# Patient Record
Sex: Female | Born: 1995 | Race: Black or African American | Hispanic: No | Marital: Single | Smoking: Former smoker
Health system: Southern US, Community
[De-identification: ages and names within clinical notes are randomized; demographics above are authoritative.]

## PROBLEM LIST (undated history)

## (undated) ENCOUNTER — Inpatient Hospital Stay: Payer: Self-pay

## (undated) DIAGNOSIS — IMO0001 Reserved for inherently not codable concepts without codable children: Secondary | ICD-10-CM

## (undated) DIAGNOSIS — E669 Obesity, unspecified: Secondary | ICD-10-CM

## (undated) DIAGNOSIS — E119 Type 2 diabetes mellitus without complications: Secondary | ICD-10-CM

## (undated) DIAGNOSIS — Z8619 Personal history of other infectious and parasitic diseases: Secondary | ICD-10-CM

## (undated) DIAGNOSIS — E282 Polycystic ovarian syndrome: Secondary | ICD-10-CM

## (undated) DIAGNOSIS — IMO0002 Reserved for concepts with insufficient information to code with codable children: Secondary | ICD-10-CM

## (undated) DIAGNOSIS — R030 Elevated blood-pressure reading, without diagnosis of hypertension: Secondary | ICD-10-CM

## (undated) DIAGNOSIS — F909 Attention-deficit hyperactivity disorder, unspecified type: Secondary | ICD-10-CM

## (undated) DIAGNOSIS — J45909 Unspecified asthma, uncomplicated: Secondary | ICD-10-CM

## (undated) DIAGNOSIS — O139 Gestational [pregnancy-induced] hypertension without significant proteinuria, unspecified trimester: Secondary | ICD-10-CM

## (undated) HISTORY — DX: Attention-deficit hyperactivity disorder, unspecified type: F90.9

## (undated) HISTORY — DX: Polycystic ovarian syndrome: E28.2

## (undated) HISTORY — DX: Obesity, unspecified: E66.9

## (undated) HISTORY — DX: Personal history of other infectious and parasitic diseases: Z86.19

## (undated) HISTORY — DX: Unspecified asthma, uncomplicated: J45.909

## (undated) HISTORY — DX: Reserved for concepts with insufficient information to code with codable children: IMO0002

## (undated) HISTORY — DX: Type 2 diabetes mellitus without complications: E11.9

## (undated) HISTORY — DX: Reserved for inherently not codable concepts without codable children: IMO0001

## (undated) HISTORY — DX: Elevated blood-pressure reading, without diagnosis of hypertension: R03.0

---
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
## 2014-03-22 ENCOUNTER — Emergency Department: Payer: Self-pay

## 2014-04-26 ENCOUNTER — Ambulatory Visit (HOSPITAL_COMMUNITY): Payer: Self-pay

## 2014-07-06 LAB — TSH: TSH: 2.3 u[IU]/mL (ref 0.41–5.90)

## 2014-07-06 LAB — BASIC METABOLIC PANEL
BUN: 13 mg/dL (ref 4–21)
Creatinine: 0.8 mg/dL (ref 0.5–1.1)
Glucose: 91 mg/dL
Potassium: 4.5 mmol/L (ref 3.4–5.3)
SODIUM: 138 mmol/L (ref 137–147)

## 2014-07-06 LAB — HEPATIC FUNCTION PANEL
ALT: 37 U/L — AB (ref 7–35)
AST: 32 U/L (ref 13–35)

## 2015-02-01 LAB — HEMOGLOBIN A1C: HEMOGLOBIN A1C: 6.6 % — AB (ref 4.0–6.0)

## 2015-02-01 LAB — LIPID PANEL
Cholesterol: 115 mg/dL (ref 0–200)
HDL: 30 mg/dL — AB (ref 35–70)
LDL CALC: 56 mg/dL
Triglycerides: 145 mg/dL (ref 40–160)

## 2015-02-01 LAB — HM PAP SMEAR: HM Pap smear: NEGATIVE

## 2015-02-27 ENCOUNTER — Ambulatory Visit: Payer: Self-pay | Admitting: *Deleted

## 2015-09-11 DIAGNOSIS — E669 Obesity, unspecified: Secondary | ICD-10-CM | POA: Insufficient documentation

## 2015-09-11 DIAGNOSIS — E119 Type 2 diabetes mellitus without complications: Secondary | ICD-10-CM | POA: Insufficient documentation

## 2015-09-11 DIAGNOSIS — L84 Corns and callosities: Secondary | ICD-10-CM | POA: Insufficient documentation

## 2015-09-11 DIAGNOSIS — J45909 Unspecified asthma, uncomplicated: Secondary | ICD-10-CM | POA: Insufficient documentation

## 2015-09-11 DIAGNOSIS — L83 Acanthosis nigricans: Secondary | ICD-10-CM | POA: Insufficient documentation

## 2015-09-11 DIAGNOSIS — L304 Erythema intertrigo: Secondary | ICD-10-CM | POA: Insufficient documentation

## 2015-09-11 DIAGNOSIS — E282 Polycystic ovarian syndrome: Secondary | ICD-10-CM | POA: Insufficient documentation

## 2015-09-11 DIAGNOSIS — Z920 Personal history of contraception: Secondary | ICD-10-CM | POA: Insufficient documentation

## 2015-09-11 DIAGNOSIS — L709 Acne, unspecified: Secondary | ICD-10-CM | POA: Insufficient documentation

## 2015-09-11 DIAGNOSIS — F909 Attention-deficit hyperactivity disorder, unspecified type: Secondary | ICD-10-CM | POA: Insufficient documentation

## 2015-09-11 DIAGNOSIS — N979 Female infertility, unspecified: Secondary | ICD-10-CM | POA: Insufficient documentation

## 2015-09-11 DIAGNOSIS — R030 Elevated blood-pressure reading, without diagnosis of hypertension: Secondary | ICD-10-CM | POA: Insufficient documentation

## 2015-09-12 ENCOUNTER — Ambulatory Visit (INDEPENDENT_AMBULATORY_CARE_PROVIDER_SITE_OTHER): Payer: Medicaid Other | Admitting: Family Medicine

## 2015-09-12 ENCOUNTER — Encounter: Payer: Self-pay | Admitting: Family Medicine

## 2015-09-12 ENCOUNTER — Telehealth: Payer: Self-pay | Admitting: Family Medicine

## 2015-09-12 VITALS — BP 104/70 | HR 98 | Temp 98.0°F | Resp 18 | Ht 65.0 in | Wt 348.0 lb

## 2015-09-12 DIAGNOSIS — N760 Acute vaginitis: Secondary | ICD-10-CM

## 2015-09-12 DIAGNOSIS — R350 Frequency of micturition: Secondary | ICD-10-CM | POA: Diagnosis not present

## 2015-09-12 DIAGNOSIS — N912 Amenorrhea, unspecified: Secondary | ICD-10-CM

## 2015-09-12 LAB — POCT URINALYSIS DIPSTICK
BILIRUBIN UA: NEGATIVE
Glucose, UA: NEGATIVE
KETONES UA: NEGATIVE
NITRITE UA: NEGATIVE
PH UA: 6
PROTEIN UA: NEGATIVE
Spec Grav, UA: 1.01
Urobilinogen, UA: 0.2

## 2015-09-12 LAB — POCT URINE PREGNANCY: Preg Test, Ur: NEGATIVE

## 2015-09-12 MED ORDER — METRONIDAZOLE 0.75 % VA GEL: 1.0000 | Freq: Every day | VAGINAL | Status: DC

## 2015-09-12 NOTE — Progress Notes (Signed)
       Patient: Carrie LombardCierra M Gilmartin Female    DOB: 06/17/1996   19 y.o.   MRN: [id number] Visit Date: 09/12/2015  Today's Provider: Mila Merryonald Fisher, MD   Chief Complaint  Patient presents with  . Vaginal Itching   Subjective:    Vaginal Itching The patient's primary symptoms include genital itching, genital lesions (bumps on the inside the vagina), a genital odor, missed menses, pelvic pain and vaginal bleeding (some spotting). The patient's pertinent negatives include no vaginal discharge. This is a new problem. Episode onset: started 2 weeks ago. The problem occurs daily. The problem has been gradually worsening. Pregnant now: unknown ; last menses was 1.5-2 months ago. Associated symptoms include abdominal pain, back pain (low back), frequency, joint pain, nausea (occasionally) and urgency. Pertinent negatives include no anorexia, chills, constipation, diarrhea, discolored urine, dysuria, fever, flank pain, headaches, hematuria, joint swelling, painful intercourse, rash, sore throat or vomiting. Treatments tried: lotion. The treatment provided no relief. She is sexually active (has a new partner). It is unknown whether or not her partner has an STD. She uses condoms (not cosistent with condom use) for contraception. Her menstrual history has been regular. Her past medical history is significant for an STD (history of Chlamydia).       Allergies  Allergen Reactions  . Cefzil  [Cefprozil]    Previous Medications   METFORMIN (GLUCOPHAGE-XR) 500 MG 24 HR TABLET    Take 2 tablets by mouth 2 (two) times daily.    Review of Systems  Constitutional: Negative for fever and chills.  HENT: Negative for sore throat.   Gastrointestinal: Positive for nausea (occasionally) and abdominal pain. Negative for vomiting, diarrhea, constipation and anorexia.  Genitourinary: Positive for urgency, frequency, pelvic pain and missed menses. Negative for dysuria, hematuria, flank pain and vaginal discharge.    Musculoskeletal: Positive for back pain (low back) and joint pain.  Skin: Negative for rash.  Neurological: Negative for headaches.    Social History  Substance Use Topics  . Smoking status: Former Smoker    Quit date: 08/12/2013  . Smokeless tobacco: Not on file  . Alcohol Use: No   Objective:   BP 104/70 mmHg  Pulse 98  Temp(Src) 98 F (36.7 C) (Oral)  Resp 18  Ht 5\' 5"  (1.651 m)  Wt 348 lb (157.852 kg)  BMI 57.91 kg/m2  SpO2 98%  LMP  (Within Months)  Physical Exam  GU: Vague swelling left distal vaginal wall. Moderate amount clear malodorous discharge. No bleeding. No cervical inflammation.     Assessment & Plan:     1. Urinary frequency  - POCT Urinalysis Dipstick  2. Amenorrhea - POCT urine pregnancy  3. Vaginitis  - metroNIDAZOLE (METROGEL VAGINAL) 0.75 % vaginal gel; Place 1 Applicatorful vaginally at bedtime. For 7 days  Dispense: 70 g; Refill: 0 - GC/chlamydia probe amp, urine       Mila Merryonald Fisher, MD  [facility]

## 2015-09-12 NOTE — Telephone Encounter (Signed)
Marchelle Folksmanda with [facility] is requesting a call back to verify the Rx for metroNIDAZOLE (METROGEL VAGINAL) 0.75 % vaginal gel.  CB#[phone]/MW

## 2015-09-13 ENCOUNTER — Encounter: Payer: Self-pay | Admitting: Family Medicine

## 2015-09-16 ENCOUNTER — Telehealth: Payer: Self-pay | Admitting: Family Medicine

## 2015-09-16 NOTE — Telephone Encounter (Signed)
Advised Tonya as below.

## 2015-09-16 NOTE — Telephone Encounter (Signed)
Tonya at [facility] needs a test number for the specimin they have on pt.  Please call back [phone]  Braulio Boschhnaks Teri

## 2015-09-18 ENCOUNTER — Telehealth: Payer: Self-pay | Admitting: Family Medicine

## 2015-09-18 NOTE — Telephone Encounter (Signed)
Please advise 

## 2015-09-18 NOTE — Telephone Encounter (Signed)
Pt is calling to see if Lab results have came back from last week. Please return pt's call @ [phone] Thanks CC

## 2015-09-19 LAB — GC/CHLAMYDIA PROBE AMP, URINE
Chlamydia, Nuc. Acid Amp: NEGATIVE
Gonococcus, Nuc. Acid Amp: NEGATIVE

## 2015-09-19 NOTE — Telephone Encounter (Signed)
Tried calling patient. Left message to call back. 

## 2015-09-19 NOTE — Telephone Encounter (Signed)
Chlamydia and gonorrhea cultures were negative. Should resolve after finishing metronidazole cream that was prescribed last week. If not then call for gyn referral.

## 2015-09-25 NOTE — Telephone Encounter (Signed)
Patient advised as directed below. Patient states she finished the metronidazole and symptoms are better now.

## 2015-10-07 ENCOUNTER — Emergency Department: Payer: Medicaid Other

## 2015-10-07 ENCOUNTER — Emergency Department
Admission: EM | Admit: 2015-10-07 | Discharge: 2015-10-07 | Disposition: A | Discharge status: Home / Self Care | Payer: Medicaid Other | Attending: Emergency Medicine | Admitting: Emergency Medicine

## 2015-10-07 ENCOUNTER — Encounter: Payer: Self-pay | Admitting: Emergency Medicine

## 2015-10-07 DIAGNOSIS — J45909 Unspecified asthma, uncomplicated: Secondary | ICD-10-CM | POA: Insufficient documentation

## 2015-10-07 DIAGNOSIS — J209 Acute bronchitis, unspecified: Secondary | ICD-10-CM

## 2015-10-07 DIAGNOSIS — Z87891 Personal history of nicotine dependence: Secondary | ICD-10-CM | POA: Diagnosis not present

## 2015-10-07 DIAGNOSIS — Z7984 Long term (current) use of oral hypoglycemic drugs: Secondary | ICD-10-CM | POA: Insufficient documentation

## 2015-10-07 DIAGNOSIS — E119 Type 2 diabetes mellitus without complications: Secondary | ICD-10-CM | POA: Insufficient documentation

## 2015-10-07 DIAGNOSIS — R51 Headache: Secondary | ICD-10-CM | POA: Diagnosis present

## 2015-10-07 DIAGNOSIS — Z79899 Other long term (current) drug therapy: Secondary | ICD-10-CM | POA: Insufficient documentation

## 2015-10-07 MED ORDER — BENZONATATE 100 MG PO CAPS
200.0000 mg | ORAL_CAPSULE | Freq: Once | ORAL | Status: AC
  Administered 2015-10-07: 200 mg via ORAL

## 2015-10-07 MED ORDER — AZITHROMYCIN 250 MG PO TABS
ORAL_TABLET | ORAL | Status: AC
  Filled 2015-10-07: qty 2

## 2015-10-07 MED ORDER — BENZONATATE 100 MG PO CAPS
ORAL_CAPSULE | ORAL | Status: AC
  Filled 2015-10-07: qty 2

## 2015-10-07 MED ORDER — BENZONATATE 200 MG PO CAPS: 200.0000 mg | ORAL_CAPSULE | Freq: Three times a day (TID) | ORAL | Status: DC | PRN

## 2015-10-07 MED ORDER — AZITHROMYCIN 250 MG PO TABS: 500.0000 mg | ORAL_TABLET | Freq: Every day | ORAL | Status: AC

## 2015-10-07 MED ORDER — AZITHROMYCIN 250 MG PO TABS
500.0000 mg | ORAL_TABLET | Freq: Once | ORAL | Status: AC
  Administered 2015-10-07: 500 mg via ORAL

## 2015-10-07 NOTE — Discharge Instructions (Signed)

## 2015-10-07 NOTE — ED Provider Notes (Signed)
[facility] Emergency Department Provider Note  ____________________________________________  Time seen: 7:05 PM  I have reviewed the triage vital signs and the nursing notes.   HISTORY  Chief Complaint Headache      HPI Carrie Garza is a 19 y.o. female presents with productive cough neck pain chest discomfort with coughing 5 days. Patient also states that she noticed red streaks in the sputum.     Past Medical History  Diagnosis Date  . Knee fracture, left 02/2003  . Diabetes mellitus without complication (HCC)   . Polycystic ovarian syndrome   . ADHD (attention deficit hyperactivity disorder)   . Obesity   . Asthma   . Elevated blood pressure   . History of chicken pox     Patient Active Problem List   Diagnosis Date Noted  . Acanthosis nigricans 09/11/2015  . Acne 09/11/2015  . ADHD (attention deficit hyperactivity disorder) 09/11/2015  . Asthma 09/11/2015  . Callus of foot 09/11/2015  . History of contraceptive use 09/11/2015  . Blood pressure elevated without history of HTN 09/11/2015  . Female infertility 09/11/2015  . Eczema intertrigo 09/11/2015  . Obesity 09/11/2015  . Polycystic ovarian syndrome 09/11/2015  . Diabetes mellitus, type 2 (HCC) 09/11/2015  . History of chlamydia 07/29/2009    Past Surgical History  Procedure Laterality Date  . Labioplasty  2008    hypertrophy B labia. Toya Smothers  . Tonsillectomy and adenoidectomy  2001    Current Outpatient Rx  Name  Route  Sig  Dispense  Refill  . metFORMIN (GLUCOPHAGE-XR) 500 MG 24 hr tablet   Oral   Take 2 tablets by mouth 2 (two) times daily.         . metroNIDAZOLE (METROGEL VAGINAL) 0.75 % vaginal gel   Vaginal   Place 1 Applicatorful vaginally at bedtime. For 7 days   70 g   0     Allergies Cefzil   Family History  Problem Relation Age of Onset  . Diabetes Mother   . Hypertension Mother   . Asthma Mother   . Obesity Mother   . Hypertension  Maternal Grandmother   . Prostate cancer Maternal Grandfather   . Hypertension Maternal Grandfather   . Diabetes Other     type 2  . Stroke Other     Social History Social History  Substance Use Topics  . Smoking status: Former Smoker    Quit date: 08/12/2013  . Smokeless tobacco: None  . Alcohol Use: No    Review of Systems  Constitutional: Negative for fever. Eyes: Negative for visual changes. ENT: Negative for sore throat. Cardiovascular: Negative for chest pain. Respiratory: Negative for shortness of breath. Positive for cough Gastrointestinal: Negative for abdominal pain, vomiting and diarrhea. Genitourinary: Negative for dysuria. Musculoskeletal: Negative for back pain. Skin: Negative for rash. Neurological: Negative for headaches, focal weakness or numbness.   10-point ROS otherwise negative.  ____________________________________________   PHYSICAL EXAM:  VITAL SIGNS: ED Triage Vitals  Enc Vitals Group     BP 10/07/15 1807 120/72 mmHg     Pulse Rate 10/07/15 1807 62     Resp 10/07/15 1807 20     Temp 10/07/15 1807 98.7 F (37.1 C)     Temp Source 10/07/15 1807 Oral     SpO2 10/07/15 1807 97 %     Weight 10/07/15 1807 333 lb (151.048 kg)     Height 10/07/15 1807  (1.676 m)     Head Cir --  Peak Flow --      Pain Score 10/07/15 1815 6     Pain Loc --      Pain Edu? --      Excl. in GC? --      Constitutional: Alert and oriented. Well appearing and in no distress. Eyes: Conjunctivae are normal. PERRL. Normal extraocular movements. ENT   Head: Normocephalic and atraumatic.   Nose: No congestion/rhinnorhea.   Mouth/Throat: Mucous membranes are moist.   Neck: No stridor. Hematological/Lymphatic/Immunilogical: No cervical lymphadenopathy. Cardiovascular: Normal rate, regular rhythm. Normal and symmetric distal pulses are present in all extremities. No murmurs, rubs, or gallops. Respiratory: Normal respiratory effort without  tachypnea nor retractions. Breath sounds are clear and equal bilaterally. No wheezes/rales/rhonchi. Gastrointestinal: Soft and nontender. No distention. There is no CVA tenderness. Genitourinary: deferred Musculoskeletal: Nontender with normal range of motion in all extremities. No joint effusions.  No lower extremity tenderness nor edema. Neurologic:  Normal speech and language. No gross focal neurologic deficits are appreciated. Speech is normal.  Skin:  Skin is warm, dry and intact. No rash noted. Psychiatric: Mood and affect are normal. Speech and behavior are normal. Patient exhibits appropriate insight and judgment.    RADIOLOGY      DG Chest 2 View (Final result) Result time: 10/07/15 19:10:08   Final result by Rad Results In Interface (10/07/15 19:10:08)   Narrative:   CLINICAL DATA: Cough  EXAM: CHEST 2 VIEW  COMPARISON: 02/03/2012  FINDINGS: The heart size and mediastinal contours are within normal limits. Both lungs are clear. The visualized skeletal structures are unremarkable.  IMPRESSION: No active cardiopulmonary disease.   Electronically Signed By: Signa Kellaylor Stroud M.D. On: 10/07/2015 19:10         INITIAL IMPRESSION / ASSESSMENT AND PLAN / ED COURSE  Pertinent labs & imaging results that were available during my care of the patient were reviewed by me and considered in my medical decision making (see chart for details).  History of physical exam consistent with acute bronchitis. Patient received azithromycin 500 mg and Tessalon Perles.  ____________________________________________   FINAL CLINICAL IMPRESSION(S) / ED DIAGNOSES  Final diagnoses:  Acute bronchitis, unspecified organism      Darci Currentandolph N Brown, MD 10/07/15 [phone]

## 2015-10-07 NOTE — ED Notes (Signed)
Spoke with dr Carollee Massedkaminski, no labs needed at this time.

## 2015-10-07 NOTE — ED Notes (Addendum)
C/o headache, neck pain, and lung pain.  Denies fevers. Pt reports hurts head if moves her neck. Has had productive cough with blood spots in it per pt.  Denies any fevers. Has been having night sweats.  Denies unwanted weight loss. Mask given to pt. Pt looks well in triage playing on phone and moving neck; smiling.

## 2015-10-07 NOTE — ED Notes (Signed)
Pt states "my lungs hurt", states pain with coughing and neck pain on the sides of her neck, states some blood tinged sputum but states she has been coughing a lot, pt ambulatory to room in no acute distress

## 2015-10-07 NOTE — ED Notes (Signed)
Pt reports productive cough since Wednesday that's been keeping her awake. Pt denies any N/V/D or known fever.

## 2015-11-14 ENCOUNTER — Encounter: Payer: Self-pay | Admitting: Family Medicine

## 2015-11-14 ENCOUNTER — Ambulatory Visit (INDEPENDENT_AMBULATORY_CARE_PROVIDER_SITE_OTHER): Payer: Medicaid Other | Admitting: Family Medicine

## 2015-11-14 VITALS — BP 110/80 | HR 77 | Temp 98.7°F | Resp 16 | Ht 65.0 in | Wt 330.0 lb

## 2015-11-14 DIAGNOSIS — E119 Type 2 diabetes mellitus without complications: Secondary | ICD-10-CM

## 2015-11-14 DIAGNOSIS — L739 Follicular disorder, unspecified: Secondary | ICD-10-CM | POA: Diagnosis not present

## 2015-11-14 LAB — POCT GLYCOSYLATED HEMOGLOBIN (HGB A1C)
ESTIMATED AVERAGE GLUCOSE: 120
HEMOGLOBIN A1C: 5.8

## 2015-11-14 MED ORDER — MUPIROCIN CALCIUM 2 % EX CREA: 1.0000 "application " | TOPICAL_CREAM | Freq: Two times a day (BID) | CUTANEOUS | Status: DC

## 2015-11-14 NOTE — Progress Notes (Signed)
Patient: Carrie Garza Female    DOB: 07/04/1996   20 y.o.   MRN: [id number] Visit Date: 11/14/2015  Today's Provider: Mila Merry, MD   Chief Complaint  Patient presents with  . Rash   Subjective:    Rash This is a new problem. The current episode started 1 to 4 weeks ago. The problem is unchanged. Location: bump on left and right. The rash is characterized by redness and blistering. Pertinent negatives include no congestion, cough, diarrhea, eye pain, facial edema, fatigue, fever, rhinorrhea, shortness of breath, sore throat or vomiting. Past treatments include nothing. The treatment provided no relief.    Bumps on both breasts for last two weeks. Red and infected. No itching, no pain.    Diabetes: Is due for follow up diabetes. Is not check sugars regularly, but feels well. Is taking metformin every day.  Lab Results  Component Value Date   HGBA1C 6.6* 02/01/2015      Allergies  Allergen Reactions  . Cefzil  [Cefprozil]    Previous Medications   METFORMIN (GLUCOPHAGE-XR) 500 MG 24 HR TABLET    Take 2 tablets by mouth 2 (two) times daily.    Review of Systems  Constitutional: Negative for fever, chills, appetite change and fatigue.  HENT: Negative for congestion, rhinorrhea and sore throat.   Eyes: Negative for pain.  Respiratory: Negative for cough, chest tightness and shortness of breath.   Cardiovascular: Negative for chest pain and palpitations.  Gastrointestinal: Negative for nausea, vomiting, abdominal pain and diarrhea.  Skin: Positive for rash.  Neurological: Negative for dizziness and weakness.    Social History  Substance Use Topics  . Smoking status: Former Smoker    Quit date: 08/12/2013  . Smokeless tobacco: Not on file  . Alcohol Use: No   Objective:   BP 110/80 mmHg  Pulse 77  Temp(Src) 98.7 F (37.1 C) (Oral)  Resp 16  Ht  (1.651 m)  Wt 330 lb (149.687 kg)  BMI 54.91 kg/m2  SpO2 96%  LMP 09/29/2015  Physical  Exam  General Appearance:    Alert, cooperative, no distress, obese  Eyes:    PERRL, conjunctiva/corneas clear, EOM's intact       Lungs:     Clear to auscultation bilaterally, respirations unlabored  Heart:    Regular rate and rhythm  Neurologic:   Awake, alert, oriented x 3. No apparent focal neurological           defect.   Skin:   Small red follicular lesion on superior aspects of right and left breasts, no surrounding erythema. Minimally tender.     Results for orders placed or performed in visit on 11/14/15  POCT glycosylated hemoglobin (Hb A1C)  Result Value Ref Range   Hemoglobin A1C 5.8    Est. average glucose Bld gHb Est-mCnc 120        Assessment & Plan:     1. Type 2 diabetes mellitus without complication, without long-term current use of insulin (HCC)  Continue current dose of metformin - POCT glycosylated hemoglobin (Hb A1C)  2. Folliculitis- New problem Deroofed lesion on left breast and swabbed for culture. Start mupirocin for empiric coverage of staph.  - Wound culture - mupirocin cream (BACTROBAN) 2 %; Apply 1 application topically 2 (two) times daily.  Dispense: 15 g; Refill: 0  Return in about 6 months (around 05/13/2016).       Mila Merry, MD  [facility]

## 2015-11-17 LAB — WOUND CULTURE: Organism ID, Bacteria: NONE SEEN

## 2016-03-05 ENCOUNTER — Encounter: Payer: Self-pay | Admitting: Family Medicine

## 2016-03-05 ENCOUNTER — Ambulatory Visit (INDEPENDENT_AMBULATORY_CARE_PROVIDER_SITE_OTHER): Payer: Medicaid Other | Admitting: Family Medicine

## 2016-03-05 VITALS — BP 112/70 | HR 62 | Temp 99.2°F | Resp 16 | Wt 310.0 lb

## 2016-03-05 DIAGNOSIS — Z3201 Encounter for pregnancy test, result positive: Secondary | ICD-10-CM | POA: Diagnosis not present

## 2016-03-05 DIAGNOSIS — R42 Dizziness and giddiness: Secondary | ICD-10-CM | POA: Insufficient documentation

## 2016-03-05 DIAGNOSIS — R1030 Lower abdominal pain, unspecified: Secondary | ICD-10-CM

## 2016-03-05 LAB — POCT URINALYSIS DIPSTICK
Bilirubin, UA: NEGATIVE
GLUCOSE UA: NEGATIVE
Ketones, UA: NEGATIVE
NITRITE UA: NEGATIVE
PROTEIN UA: NEGATIVE
Spec Grav, UA: 1.025
UROBILINOGEN UA: 0.2
pH, UA: 6

## 2016-03-05 LAB — POCT URINE PREGNANCY: PREG TEST UR: POSITIVE — AB

## 2016-03-05 NOTE — Progress Notes (Signed)
Patient: Carrie Garza Female    DOB: 05/16/1996   20 y.o.   MRN: [id number] Visit Date: 03/05/2016  Today's Provider: Mila Merry, MD   Chief Complaint  Patient presents with  . Abdominal Pain  . Dizziness   Subjective:    HPI Comments: Pain has only occurred twice, each episode was crampy, occurred in lower pelvis, and lasted about 30 seconds. The last episode was 3 days ago.   Abdominal Pain This is a new problem. Episode onset: 2 weeks ago. The problem occurs intermittently. The problem has been unchanged. Pain location: lowe abdomen; patient describes the location in her uterus  The quality of the pain is sharp. The abdominal pain radiates to the perineum. Associated symptoms include constipation, headaches and weight loss. Pertinent negatives include no anorexia, arthralgias, belching, diarrhea, dysuria, fever, flatus, frequency, hematuria, melena, myalgias, nausea or vomiting. Nothing aggravates the pain.  Dizziness This is a new problem. Episode onset: 1 month ago. The problem occurs intermittently. Associated symptoms include abdominal pain and headaches. Pertinent negatives include no anorexia, arthralgias, chest pain, chills, congestion, coughing, diaphoresis, fatigue, fever, myalgias, nausea, numbness, sore throat, urinary symptoms, vomiting or weakness. The symptoms are aggravated by bending.  Dizziness occurs after bending over or turning head quickly. Patient states she sometimes feels off balance.   She states LMP was mid April. She has had no bleeding since period stopped over 3 weeks ago.      Allergies  Allergen Reactions  . Cefzil  [Cefprozil]    Previous Medications   METFORMIN (GLUCOPHAGE-XR) 500 MG 24 HR TABLET    Take 2 tablets by mouth 2 (two) times daily.   MUPIROCIN CREAM (BACTROBAN) 2 %    Apply 1 application topically 2 (two) times daily.    Review of Systems  Constitutional: Positive for weight loss. Negative for fever, chills, diaphoresis,  appetite change and fatigue.  HENT: Negative for congestion and sore throat.   Respiratory: Negative for cough, chest tightness and shortness of breath.   Cardiovascular: Negative for chest pain and palpitations.  Gastrointestinal: Positive for abdominal pain and constipation. Negative for nausea, vomiting, diarrhea, melena, anorexia and flatus.  Genitourinary: Negative for dysuria, frequency and hematuria.       Tender breast  Musculoskeletal: Negative for myalgias and arthralgias.  Neurological: Positive for dizziness, light-headedness and headaches. Negative for weakness and numbness.    Social History  Substance Use Topics  . Smoking status: Former Smoker    Quit date: 08/12/2013  . Smokeless tobacco: Not on file  . Alcohol Use: No   Objective:   BP 112/70 mmHg  Pulse 62  Temp(Src) 99.2 F (37.3 C) (Oral)  Resp 16  Wt 310 lb (140.615 kg)  SpO2 99%  Physical Exam  General Appearance:    Alert, cooperative, no distress, obese  Eyes:    PERRL, conjunctiva/corneas clear, EOM's intact       Lungs:     Clear to auscultation bilaterally, respirations unlabored  Heart:    Regular rate and rhythm  Neurologic:   Awake, alert, oriented x 3. No apparent focal neurological           defect.            Assessment & Plan:     1. Lower abdominal pain Two brief episodes, now resolved.  - POCT urinalysis dipstick - POCT urine pregnancy - Comprehensive metabolic panel - CBC - hCG, Beta Subunit, Qn (Serial)  2. Positive pregnancy  test  - Comprehensive metabolic panel - CBC - hCG, Beta Subunit, Qn (Serial)  3. Dizziness  - Comprehensive metabolic panel - CBC - hCG, Beta Subunit, Qn (Serial)     The entirety of the information documented in the History of Present Illness, Review of Systems and Physical Exam were personally obtained by me. Portions of this information were initially documented by Awilda Billoshena Chambers, CMA and reviewed by me for thoroughness and accuracy.     Mila Merryonald Osualdo Hansell, MD  [facility]\ \

## 2016-03-06 ENCOUNTER — Telehealth: Payer: Self-pay | Admitting: *Deleted

## 2016-03-06 ENCOUNTER — Telehealth: Payer: Self-pay | Admitting: Family Medicine

## 2016-03-06 DIAGNOSIS — Z3201 Encounter for pregnancy test, result positive: Secondary | ICD-10-CM

## 2016-03-06 LAB — COMPREHENSIVE METABOLIC PANEL
ALT: 24 IU/L (ref 0–32)
AST: 23 IU/L (ref 0–40)
Albumin/Globulin Ratio: 1.6 (ref 1.2–2.2)
Albumin: 4.1 g/dL (ref 3.5–5.5)
Alkaline Phosphatase: 53 IU/L (ref 39–117)
BUN / CREAT RATIO: 8 — AB (ref 9–23)
BUN: 6 mg/dL (ref 6–20)
Bilirubin Total: 0.3 mg/dL (ref 0.0–1.2)
CALCIUM: 9.5 mg/dL (ref 8.7–10.2)
CHLORIDE: 105 mmol/L (ref 96–106)
CO2: 17 mmol/L — AB (ref 18–29)
Creatinine, Ser: 0.71 mg/dL (ref 0.57–1.00)
GFR calc Af Amer: 140 mL/min/{1.73_m2} (ref >59)
GFR calc non Af Amer: 121 mL/min/{1.73_m2} (ref >59)
GLOBULIN, TOTAL: 2.6 g/dL (ref 1.5–4.5)
Glucose: 101 mg/dL — ABNORMAL HIGH (ref 65–99)
Potassium: 4 mmol/L (ref 3.5–5.2)
SODIUM: 139 mmol/L (ref 134–144)
TOTAL PROTEIN: 6.7 g/dL (ref 6.0–8.5)

## 2016-03-06 LAB — CBC
Hematocrit: 36.6 % (ref 34.0–46.6)
Hemoglobin: 12.1 g/dL (ref 11.1–15.9)
MCH: 27.1 pg (ref 26.6–33.0)
MCHC: 33.1 g/dL (ref 31.5–35.7)
MCV: 82 fL (ref 79–97)
PLATELETS: 277 10*3/uL (ref 150–379)
RBC: 4.46 x10E6/uL (ref 3.77–5.28)
RDW: 15.6 % — AB (ref 12.3–15.4)
WBC: 7.7 10*3/uL (ref 3.4–10.8)

## 2016-03-06 LAB — HCG, BETA SUBUNIT, QN (SERIAL): HCG, Beta Chain, Quant, S: 12345 m[IU]/mL

## 2016-03-06 NOTE — Telephone Encounter (Signed)
-----   Message from Malva Limesonald E Fisher, MD sent at 03/06/2016  7:54 AM EDT ----- Labs confirm pregnancy, otherwise normal. Recommend referral to gyn if she doesn't already have one.

## 2016-03-06 NOTE — Telephone Encounter (Signed)
Please schedule appt to gyn? Thanks!

## 2016-03-06 NOTE — Telephone Encounter (Signed)
Patient was notified of results. Expressed understanding.  

## 2016-03-06 NOTE — Telephone Encounter (Signed)
Pt is requesting results of lab work.Phone # in chart is correct

## 2016-03-13 ENCOUNTER — Emergency Department
Admission: EM | Admit: 2016-03-13 | Discharge: 2016-03-13 | Disposition: A | Discharge status: Home / Self Care | Payer: Medicaid Other | Attending: Emergency Medicine | Admitting: Emergency Medicine

## 2016-03-13 ENCOUNTER — Encounter: Payer: Self-pay | Admitting: Emergency Medicine

## 2016-03-13 ENCOUNTER — Emergency Department: Payer: Medicaid Other

## 2016-03-13 DIAGNOSIS — O209 Hemorrhage in early pregnancy, unspecified: Secondary | ICD-10-CM

## 2016-03-13 DIAGNOSIS — A599 Trichomoniasis, unspecified: Secondary | ICD-10-CM

## 2016-03-13 DIAGNOSIS — J45909 Unspecified asthma, uncomplicated: Secondary | ICD-10-CM | POA: Diagnosis not present

## 2016-03-13 DIAGNOSIS — F909 Attention-deficit hyperactivity disorder, unspecified type: Secondary | ICD-10-CM | POA: Diagnosis not present

## 2016-03-13 DIAGNOSIS — Z3A01 Less than 8 weeks gestation of pregnancy: Secondary | ICD-10-CM | POA: Insufficient documentation

## 2016-03-13 DIAGNOSIS — E119 Type 2 diabetes mellitus without complications: Secondary | ICD-10-CM | POA: Insufficient documentation

## 2016-03-13 DIAGNOSIS — Z87891 Personal history of nicotine dependence: Secondary | ICD-10-CM | POA: Insufficient documentation

## 2016-03-13 DIAGNOSIS — A5901 Trichomonal vulvovaginitis: Secondary | ICD-10-CM | POA: Insufficient documentation

## 2016-03-13 DIAGNOSIS — O200 Threatened abortion: Secondary | ICD-10-CM | POA: Diagnosis not present

## 2016-03-13 DIAGNOSIS — Z7984 Long term (current) use of oral hypoglycemic drugs: Secondary | ICD-10-CM | POA: Insufficient documentation

## 2016-03-13 LAB — ABO/RH: ABO/RH(D): B POS

## 2016-03-13 LAB — RAPID HIV SCREEN (HIV 1/2 AB+AG)
HIV 1/2 ANTIBODIES: NONREACTIVE
HIV-1 P24 Antigen - HIV24: NONREACTIVE

## 2016-03-13 LAB — URINALYSIS COMPLETE WITH MICROSCOPIC (ARMC ONLY)
Bacteria, UA: NONE SEEN
Bilirubin Urine: NEGATIVE
GLUCOSE, UA: NEGATIVE mg/dL
KETONES UR: NEGATIVE mg/dL
NITRITE: NEGATIVE
PROTEIN: 30 mg/dL — AB
SPECIFIC GRAVITY, URINE: 1.013 (ref 1.005–1.030)
pH: 6 (ref 5.0–8.0)

## 2016-03-13 LAB — WET PREP, GENITAL
SPERM: NONE SEEN
YEAST WET PREP: NONE SEEN

## 2016-03-13 LAB — CBC
HCT: 39.3 % (ref 35.0–47.0)
HEMOGLOBIN: 12.8 g/dL (ref 12.0–16.0)
MCH: 27.7 pg (ref 26.0–34.0)
MCHC: 32.7 g/dL (ref 32.0–36.0)
MCV: 84.8 fL (ref 80.0–100.0)
PLATELETS: 262 10*3/uL (ref 150–440)
RBC: 4.63 MIL/uL (ref 3.80–5.20)
RDW: 15.1 % — ABNORMAL HIGH (ref 11.5–14.5)
WBC: 10.9 10*3/uL (ref 3.6–11.0)

## 2016-03-13 LAB — HCG, QUANTITATIVE, PREGNANCY: HCG, BETA CHAIN, QUANT, S: 18674 m[IU]/mL — AB (ref <5)

## 2016-03-13 LAB — CHLAMYDIA/NGC RT PCR (ARMC ONLY)
Chlamydia Tr: NOT DETECTED
N GONORRHOEAE: NOT DETECTED

## 2016-03-13 LAB — POCT PREGNANCY, URINE: PREG TEST UR: POSITIVE — AB

## 2016-03-13 MED ORDER — METRONIDAZOLE 500 MG PO TABS: 500.0000 mg | ORAL_TABLET | Freq: Two times a day (BID) | ORAL | Status: AC

## 2016-03-13 NOTE — Discharge Instructions (Signed)
Threatened Miscarriage  A threatened miscarriage is when you have vaginal bleeding during your first 20 weeks of pregnancy but the pregnancy has not ended. Your doctor will do tests to make sure you are still pregnant. The cause of the bleeding may not be known. This condition does not mean your pregnancy will end. It does increase the risk of it ending (complete miscarriage).  HOME CARE    Make sure you keep all your doctor visits for prenatal care.   Get plenty of rest.   Do not have sex or use tampons if you have vaginal bleeding.   Do not douche.   Do not smoke or use drugs.   Do not drink alcohol.   Avoid caffeine.  GET HELP IF:   You have light bleeding from your vagina.   You have belly pain or cramping.   You have a fever.  GET HELP RIGHT AWAY IF:    You have heavy bleeding from your vagina.   You have clots of blood coming from your vagina.   You have bad pain or cramps in your low back or belly.   You have fever, chills, and bad belly pain.  MAKE SURE YOU:    Understand these instructions.   Will watch your condition.   Will get help right away if you are not doing well or get worse.     This information is not intended to replace advice given to you by your health care provider. Make sure you discuss any questions you have with your health care provider.     Document Released: 09/10/2008 Document Revised: 10/03/2013 Document Reviewed: 07/25/2013  [employer]vier Interactive Patient Education 2016 [employer]

## 2016-03-13 NOTE — ED Provider Notes (Addendum)
[facility] Emergency Department Provider Note  ____________________________________________   I have reviewed the triage vital signs and the nursing notes.   HISTORY  Chief Complaint Vaginal Bleeding    HPI Carrie Garza is a 20 y.o. female who is approximately [redacted] weeks pregnant by last period at the early part of April presents today complaining of vaginal spotting. Denies any pain. She states she did have a pain in her rectal area briefly but that is gone. She denies any fever chills nausea or vomiting. Denies dysuria or urinary frequency. Denies passing any products of conception. States the spotting seems to be getting worse today. Still less than a period. She does have a history of polycystic ovarian disease, no history of significant surgery. Morbid obesity is also unfortunately present.     Past Medical History  Diagnosis Date  . Knee fracture, left 02/2003  . Diabetes mellitus without complication (HCC)   . Polycystic ovarian syndrome   . ADHD (attention deficit hyperactivity disorder)   . Obesity   . Asthma   . Elevated blood pressure   . History of chicken pox     Patient Active Problem List   Diagnosis Date Noted  . Dizziness 03/05/2016  . Positive pregnancy test 03/05/2016  . Acanthosis nigricans 09/11/2015  . Acne 09/11/2015  . ADHD (attention deficit hyperactivity disorder) 09/11/2015  . Asthma 09/11/2015  . Callus of foot 09/11/2015  . History of contraceptive use 09/11/2015  . Blood pressure elevated without history of HTN 09/11/2015  . Female infertility 09/11/2015  . Eczema intertrigo 09/11/2015  . Obesity 09/11/2015  . Polycystic ovarian syndrome 09/11/2015  . Diabetes mellitus, type 2 (HCC) 09/11/2015  . History of chlamydia 07/29/2009    Past Surgical History  Procedure Laterality Date  . Labioplasty  2008    hypertrophy B labia. Toya SmothersKnowles Jonas  . Tonsillectomy and adenoidectomy  2001    Current Outpatient Rx   Name  Route  Sig  Dispense  Refill  . metFORMIN (GLUCOPHAGE-XR) 500 MG 24 hr tablet   Oral   Take 2 tablets by mouth 2 (two) times daily.         . mupirocin cream (BACTROBAN) 2 %   Topical   Apply 1 application topically 2 (two) times daily.   15 g   0     Allergies Cefzil   Family History  Problem Relation Age of Onset  . Diabetes Mother   . Hypertension Mother   . Asthma Mother   . Obesity Mother   . Hypertension Maternal Grandmother   . Prostate cancer Maternal Grandfather   . Hypertension Maternal Grandfather   . Diabetes Other     type 2  . Stroke Other     Social History Social History  Substance Use Topics  . Smoking status: Former Smoker    Quit date: 08/12/2013  . Smokeless tobacco: Never Used  . Alcohol Use: No    Review of Systems Constitutional: No fever/chills Eyes: No visual changes. ENT: No sore throat. No stiff neck no neck pain Cardiovascular: Denies chest pain. Respiratory: Denies shortness of breath. Gastrointestinal:   no vomiting.  No diarrhea.  No constipation. Genitourinary: Negative for dysuria. Musculoskeletal: Negative lower extremity swelling Skin: Negative for rash. Neurological: Negative for headaches, focal weakness or numbness. 10-point ROS otherwise negative.  ____________________________________________   PHYSICAL EXAM:  VITAL SIGNS: ED Triage Vitals  Enc Vitals Group     BP 03/13/16 1917 136/68 mmHg  Pulse Rate 03/13/16 1917 67     Resp 03/13/16 1917 14     Temp 03/13/16 1917 98.6 F (37 C)     Temp Source 03/13/16 1917 Oral     SpO2 03/13/16 1917 100 %     Weight 03/13/16 1917 298 lb (135.172 kg)     Height 03/13/16 1917  (1.676 m)     Head Cir --      Peak Flow --      Pain Score --      Pain Loc --      Pain Edu? --      Excl. in GC? --     Constitutional: Alert and oriented. Well appearing and in no acute distress. Eyes: Conjunctivae are normal. PERRL. EOMI. Head: Atraumatic. Nose: No  congestion/rhinnorhea. Mouth/Throat: Mucous membranes are moist.  Oropharynx non-erythematous. Neck: No stridor.   Nontender with no meningismus Cardiovascular: Normal rate, regular rhythm. Grossly normal heart sounds.  Good peripheral circulation. Respiratory: Normal respiratory effort.  No retractions. Lungs CTAB. Abdominal: Soft and nontender. No distention. No guarding no Rebound, morbid obesity noted Back:  There is no focal tenderness or step off there is no midline tenderness there are no lesions noted. there is no CVA tenderness Pelvic exam: Female nurse chaperone present, no external lesions noted, physiologic vaginal discharge noted with no purulent discharge, no cervical motion tenderness, no adnexal tenderness or mass, there is no significant uterine tenderness or mass. Bimanual exam limited by morbid obesity. There is mild vaginal bleeding with no clots noted. Musculoskeletal: No lower extremity tenderness. No joint effusions, no DVT signs strong distal pulses no edema Neurologic:  Normal speech and language. No gross focal neurologic deficits are appreciated.  Skin:  Skin is warm, dry and intact. No rash noted. Psychiatric: Mood and affect are normal. Speech and behavior are normal.  ____________________________________________   LABS (all labs ordered are listed, but only abnormal results are displayed)  Labs Reviewed  HCG, QUANTITATIVE, PREGNANCY - Abnormal; Notable for the following:    hCG, Beta Chain, Quant, S 91478 (*)    All other components within normal limits  CBC - Abnormal; Notable for the following:    RDW 15.1 (*)    All other components within normal limits  URINALYSIS COMPLETEWITH MICROSCOPIC ([facility] ONLY) - Abnormal; Notable for the following:    Color, Urine YELLOW (*)    APPearance CLEAR (*)    Hgb urine dipstick 3+ (*)    Protein, ur 30 (*)    Leukocytes, UA TRACE (*)    Squamous Epithelial / LPF 6-30 (*)    All other components within normal limits   POCT PREGNANCY, URINE - Abnormal; Notable for the following:    Preg Test, Ur POSITIVE (*)    All other components within normal limits  CHLAMYDIA/NGC RT PCR ([facility] ONLY)  WET PREP, GENITAL   ____________________________________________  EKG  I personally interpreted any EKGs ordered by me or triage  ____________________________________________  RADIOLOGY  I reviewed any imaging ordered by me or triage that were performed during my shift and, if possible, patient and/or family made aware of any abnormal findings. ____________________________________________   PROCEDURES  Procedure(s) performed: None  Critical Care performed: None  ____________________________________________   INITIAL IMPRESSION / ASSESSMENT AND PLAN / ED COURSE  Pertinent labs & imaging results that were available during my care of the patient were reviewed by me and considered in my medical decision making (see chart for details).  Patient with vaginal  bleeding in early pregnancy, no clear lateralizing signs the morbid obesity limits the exam. We will obtain ultrasound to further evaluate the pregnancy. Clue cells and Trichomonas noted. We will treat the patient with Flagyl by mouth. We will send HIV and RPR for outpatient follow-up. Awaiting ultrasound results.    ----------------------------------------- 11:43 PM on 03/13/2016 -----------------------------------------  Serial abdominal exams benign, patient does have early pregnancy. No ectopic noted. Advised her to follow up with her OB for recheck. The patient is in no acute distress. She is aware of the Trichomonas and the need to have partners tested. We did advise prenatal vitamins. Return precautions given for bleeding pain etc. Patient and mother understand instructions. Patient was amenable to having me talked about her medical problems in front of her mother. ____________________________________________   FINAL CLINICAL  IMPRESSION(S) / ED DIAGNOSES  Final diagnoses:  None      This chart was dictated using voice recognition software.  Despite best efforts to proofread,  errors can occur which can change meaning.      Jeanmarie Plant, MD 03/13/16 2127  Jeanmarie Plant, MD 03/13/16 1610  Jeanmarie Plant, MD 03/13/16 [phone]

## 2016-03-13 NOTE — ED Notes (Signed)
Pt states she noticed spotting starting yesterday and blood clot in toilet today. Denies pain.

## 2016-03-13 NOTE — ED Notes (Signed)
Pt states is 6-[redacted] weeks pregnant and has light red vaginal bleeding since today with exception of "a sharp pain that was in my butthole today". Pt denies pain. Pt denies fever, vomiting.

## 2016-03-14 ENCOUNTER — Emergency Department
Admission: EM | Admit: 2016-03-14 | Discharge: 2016-03-15 | Disposition: A | Discharge status: Home / Self Care | Payer: Medicaid Other | Attending: Emergency Medicine | Admitting: Emergency Medicine

## 2016-03-14 DIAGNOSIS — Z87891 Personal history of nicotine dependence: Secondary | ICD-10-CM | POA: Insufficient documentation

## 2016-03-14 DIAGNOSIS — E119 Type 2 diabetes mellitus without complications: Secondary | ICD-10-CM | POA: Insufficient documentation

## 2016-03-14 DIAGNOSIS — Z7984 Long term (current) use of oral hypoglycemic drugs: Secondary | ICD-10-CM | POA: Diagnosis not present

## 2016-03-14 DIAGNOSIS — Z3A01 Less than 8 weeks gestation of pregnancy: Secondary | ICD-10-CM | POA: Insufficient documentation

## 2016-03-14 DIAGNOSIS — O200 Threatened abortion: Secondary | ICD-10-CM | POA: Diagnosis present

## 2016-03-14 DIAGNOSIS — F909 Attention-deficit hyperactivity disorder, unspecified type: Secondary | ICD-10-CM | POA: Insufficient documentation

## 2016-03-14 DIAGNOSIS — O039 Complete or unspecified spontaneous abortion without complication: Secondary | ICD-10-CM | POA: Insufficient documentation

## 2016-03-14 LAB — CBC
HCT: 38.3 % (ref 35.0–47.0)
Hemoglobin: 12.8 g/dL (ref 12.0–16.0)
MCH: 27.5 pg (ref 26.0–34.0)
MCHC: 33.3 g/dL (ref 32.0–36.0)
MCV: 82.7 fL (ref 80.0–100.0)
PLATELETS: 257 10*3/uL (ref 150–440)
RBC: 4.63 MIL/uL (ref 3.80–5.20)
RDW: 15.3 % — AB (ref 11.5–14.5)
WBC: 8.6 10*3/uL (ref 3.6–11.0)

## 2016-03-14 LAB — HCG, QUANTITATIVE, PREGNANCY: HCG, BETA CHAIN, QUANT, S: 15182 m[IU]/mL — AB (ref <5)

## 2016-03-14 NOTE — ED Notes (Addendum)
Pt reports she is approx 6 to [redacted] weeks pregnant and on Thursday afternoon she developed vaginal bleeding and lower abd pain. Pt was seen here last night and dx'd with threatened miscarriage. Pt reports the pain is worse and that she passed something that was bloody and white looking. Pt reports the only other bleeding she has had is a few small spots of pink looking in her panties. Pt reports she has not bleed enough to wear a pad.

## 2016-03-14 NOTE — ED Notes (Signed)
1st RN: Pt seen last night, DC'd w/ threatened miscarriage. Pt returns tonight w/ "large clot" in paper towel and reports of spotting. Pt in no acute distress at this time, although reports pain. Pt given pad, urine cup to put possible products of conception inside rather than paper towel.

## 2016-03-14 NOTE — ED Notes (Signed)
Pt with increased tissue and blood passage. Pt has saturated 2 pads in last 30 minutes since arrival to ed. Pt states pelvic cramps are improved. Skin pwd. Pt assisted with changing of pads, vss, hr 90, resps 16.

## 2016-03-15 LAB — RPR: RPR Ser Ql: NONREACTIVE

## 2016-03-15 LAB — URINE CULTURE

## 2016-03-15 MED ORDER — HYDROCODONE-ACETAMINOPHEN 5-325 MG PO TABS: 1.0000 | ORAL_TABLET | Freq: Four times a day (QID) | ORAL | Status: DC | PRN

## 2016-03-15 MED ORDER — HYDROCODONE-ACETAMINOPHEN 5-325 MG PO TABS
1.0000 | ORAL_TABLET | Freq: Once | ORAL | Status: AC
  Administered 2016-03-15: 1 via ORAL
  Filled 2016-03-15: qty 1

## 2016-03-15 NOTE — ED Notes (Signed)
md and alyssa, ed tech in to perform pelvic exam.

## 2016-03-15 NOTE — Discharge Instructions (Signed)
1. You may take pain medicine as needed (Norco #15). 2. Rest and drink plenty of fluids this weekend. 3. Return to the ER for worsening symptoms, soaking more than one maxi pad per hour, feeling faint or other concerns.  Miscarriage A miscarriage is the sudden loss of an unborn baby (fetus) before the 20th week of pregnancy. Most miscarriages happen in the first 3 months of pregnancy. Sometimes, it happens before a woman even knows she is pregnant. A miscarriage is also called a "spontaneous miscarriage" or "early pregnancy loss." Having a miscarriage can be an emotional experience. Talk with your caregiver about any questions you may have about miscarrying, the grieving process, and your future pregnancy plans. CAUSES   Problems with the fetal chromosomes that make it impossible for the baby to develop normally. Problems with the baby's genes or chromosomes are most often the result of errors that occur, by chance, as the embryo divides and grows. The problems are not inherited from the parents.  Infection of the cervix or uterus.   Hormone problems.   Problems with the cervix, such as having an incompetent cervix. This is when the tissue in the cervix is not strong enough to hold the pregnancy.   Problems with the uterus, such as an abnormally shaped uterus, uterine fibroids, or congenital abnormalities.   Certain medical conditions.   Smoking, drinking alcohol, or taking illegal drugs.   Trauma.  Often, the cause of a miscarriage is unknown.  SYMPTOMS   Vaginal bleeding or spotting, with or without cramps or pain.  Pain or cramping in the abdomen or lower back.  Passing fluid, tissue, or blood clots from the vagina. DIAGNOSIS  Your caregiver will perform a physical exam. You may also have an ultrasound to confirm the miscarriage. Blood or urine tests may also be ordered. TREATMENT   Sometimes, treatment is not necessary if you naturally pass all the fetal tissue that was  in the uterus. If some of the fetus or placenta remains in the body (incomplete miscarriage), tissue left behind may become infected and must be removed. Usually, a dilation and curettage (D and C) procedure is performed. During a D and C procedure, the cervix is widened (dilated) and any remaining fetal or placental tissue is gently removed from the uterus.  Antibiotic medicines are prescribed if there is an infection. Other medicines may be given to reduce the size of the uterus (contract) if there is a lot of bleeding.  If you have Rh negative blood and your baby was Rh positive, you will need a Rh immunoglobulin shot. This shot will protect any future baby from having Rh blood problems in future pregnancies. HOME CARE INSTRUCTIONS   Your caregiver may order bed rest or may allow you to continue light activity. Resume activity as directed by your caregiver.  Have someone help with home and family responsibilities during this time.   Keep track of the number of sanitary pads you use each day and how soaked (saturated) they are. Write down this information.   Do not use tampons. Do not douche or have sexual intercourse until approved by your caregiver.   Only take over-the-counter or prescription medicines for pain or discomfort as directed by your caregiver.   Do not take aspirin. Aspirin can cause bleeding.   Keep all follow-up appointments with your caregiver.   If you or your partner have problems with grieving, talk to your caregiver or seek counseling to help cope with the pregnancy loss. Allow  enough time to grieve before trying to get pregnant again.  SEEK IMMEDIATE MEDICAL CARE IF:   You have severe cramps or pain in your back or abdomen.  You have a fever.  You pass large blood clots (walnut-sized or larger) ortissue from your vagina. Save any tissue for your caregiver to inspect.   Your bleeding increases.   You have a thick, bad-smelling vaginal  discharge.  You become lightheaded, weak, or you faint.   You have chills.  MAKE SURE YOU:  Understand these instructions.  Will watch your condition.  Will get help right away if you are not doing well or get worse.   This information is not intended to replace advice given to you by your health care provider. Make sure you discuss any questions you have with your health care provider.   Document Released: 03/24/2001 Document Revised: 01/23/2013 Document Reviewed: 11/17/2011 Elsevier Interactive Patient Education [employer]2016 [employer]vier Inc.

## 2016-03-15 NOTE — ED Provider Notes (Signed)
[facility] Emergency Department Provider Note   ____________________________________________  Time seen: Approximately 12:02 AM  I have reviewed the triage vital signs and the nursing notes.   HISTORY  Chief Complaint Threatened Miscarriage    HPI Carrie Garza is a 20 y.o. female who presents to the ED from home with a chief complaint of vaginal bleeding. Patient is G1 P0 who was seenlast night for same. She has had an ultrasound which demonstrated intrauterine gestational sac. Returns for heavier bleeding with clots and pelvic pain. Denies chest pain, shortness of breath, feeling faint, nausea or vomiting. Denies recent travel or trauma. Nothing makes her symptoms better or worse.   Past Medical History  Diagnosis Date  . Knee fracture, left 02/2003  . Diabetes mellitus without complication (HCC)   . Polycystic ovarian syndrome   . ADHD (attention deficit hyperactivity disorder)   . Obesity   . Asthma   . Elevated blood pressure   . History of chicken pox     Patient Active Problem List   Diagnosis Date Noted  . Dizziness 03/05/2016  . Positive pregnancy test 03/05/2016  . Acanthosis nigricans 09/11/2015  . Acne 09/11/2015  . ADHD (attention deficit hyperactivity disorder) 09/11/2015  . Asthma 09/11/2015  . Callus of foot 09/11/2015  . History of contraceptive use 09/11/2015  . Blood pressure elevated without history of HTN 09/11/2015  . Female infertility 09/11/2015  . Eczema intertrigo 09/11/2015  . Obesity 09/11/2015  . Polycystic ovarian syndrome 09/11/2015  . Diabetes mellitus, type 2 (HCC) 09/11/2015  . History of chlamydia 07/29/2009    Past Surgical History  Procedure Laterality Date  . Labioplasty  2008    hypertrophy B labia. Toya Smothers  . Tonsillectomy and adenoidectomy  2001    Current Outpatient Rx  Name  Route  Sig  Dispense  Refill  . HYDROcodone-acetaminophen (NORCO) 5-325 MG tablet   Oral   Take 1 tablet  by mouth every 6 (six) hours as needed for moderate pain.   15 tablet   0   . metFORMIN (GLUCOPHAGE-XR) 500 MG 24 hr tablet   Oral   Take 2 tablets by mouth 2 (two) times daily.         . metroNIDAZOLE (FLAGYL) 500 MG tablet   Oral   Take 1 tablet (500 mg total) by mouth 2 (two) times daily.   14 tablet   0   . mupirocin cream (BACTROBAN) 2 %   Topical   Apply 1 application topically 2 (two) times daily.   15 g   0     Allergies Cefzil   Family History  Problem Relation Age of Onset  . Diabetes Mother   . Hypertension Mother   . Asthma Mother   . Obesity Mother   . Hypertension Maternal Grandmother   . Prostate cancer Maternal Grandfather   . Hypertension Maternal Grandfather   . Diabetes Other     type 2  . Stroke Other     Social History Social History  Substance Use Topics  . Smoking status: Former Smoker    Quit date: 08/12/2013  . Smokeless tobacco: Never Used  . Alcohol Use: No    Review of Systems  Constitutional: No fever/chills. Eyes: No visual changes. ENT: No sore throat. Cardiovascular: Denies chest pain. Respiratory: Denies shortness of breath. Gastrointestinal: No abdominal pain.  Positive for pelvic pain. No nausea, no vomiting.  No diarrhea.  No constipation. Genitourinary: Positive for vaginal bleeding. Negative for  dysuria. Musculoskeletal: Negative for back pain. Skin: Negative for rash. Neurological: Negative for headaches, focal weakness or numbness.  10-point ROS otherwise negative.  ____________________________________________   PHYSICAL EXAM:  VITAL SIGNS: ED Triage Vitals  Enc Vitals Group     BP 03/14/16 2216 131/65 mmHg     Pulse Rate 03/14/16 2216 54     Resp 03/14/16 2216 18     Temp 03/14/16 2216 99.1 F (37.3 C)     Temp Source 03/14/16 2216 Oral     SpO2 03/14/16 2216 100 %     Weight 03/14/16 2216 298 lb (135.172 kg)     Height 03/14/16 2216 5\' 6"  (1.676 m)     Head Cir --      Peak Flow --      Pain  Score --      Pain Loc --      Pain Edu? --      Excl. in GC? --     Constitutional: Alert and oriented. Well appearing and in no acute distress. Eyes: Conjunctivae are normal. PERRL. EOMI. Head: Atraumatic. Nose: No congestion/rhinnorhea. Mouth/Throat: Mucous membranes are moist.  Oropharynx non-erythematous. Neck: No stridor.   Cardiovascular: Normal rate, regular rhythm. Grossly normal heart sounds.  Good peripheral circulation. Respiratory: Normal respiratory effort.  No retractions. Lungs CTAB. Gastrointestinal: Soft and nontender. No distention. No abdominal bruits. No CVA tenderness. Musculoskeletal: No lower extremity tenderness nor edema.  No joint effusions. Neurologic:  Normal speech and language. No gross focal neurologic deficits are appreciated. No gait instability. Skin:  Skin is warm, dry and intact. No rash noted. Psychiatric: Mood and affect are normal. Speech and behavior are normal.  ____________________________________________   LABS (all labs ordered are listed, but only abnormal results are displayed)  Labs Reviewed  CBC - Abnormal; Notable for the following:    RDW 15.3 (*)    All other components within normal limits  HCG, QUANTITATIVE, PREGNANCY - Abnormal; Notable for the following:    hCG, Beta Chain, Quant, S 15182 (*)    All other components within normal limits   ____________________________________________  EKG  None ____________________________________________  RADIOLOGY  None ____________________________________________   PROCEDURES  Procedure(s) performed:   Pelvic exam: External exam WNL without rashes, lesions or vesicles. Speculum exam reveals moderate vaginal bleeding. Cervical os closed. Bimanual exam WNL.  Critical Care performed: No  ____________________________________________   INITIAL IMPRESSION / ASSESSMENT AND PLAN / ED COURSE  Pertinent labs & imaging results that were available during my care of the patient  were reviewed by me and considered in my medical decision making (see chart for details).  20 year old female G1 P0 approximately [redacted] weeks pregnant by ultrasound last night who returns for heavier vaginal bleeding. While in the lobby, patient passed multiple blood clots and fetal products. Her bleeding has lessened as well as her pelvic discomfort. She has an appointment with [facility] scheduled for next week already. I advised her to increase her fluid intake, perform pelvic rest and keep her appointment. In looking at her chart, her blood type is B+. She has no signs of anemia on lab work tonight, and her beta hCG has decreased from last night. Strict return precautions given. Patient and mother verbalize understanding and agree with plan of care. ____________________________________________   FINAL CLINICAL IMPRESSION(S) / ED DIAGNOSES  Final diagnoses:  Miscarriage      NEW MEDICATIONS STARTED DURING THIS VISIT:  New Prescriptions   HYDROCODONE-ACETAMINOPHEN (NORCO) 5-325 MG TABLET  Take 1 tablet by mouth every 6 (six) hours as needed for moderate pain.     Note:  This document was prepared using Dragon voice recognition software and may include unintentional dictation errors.    Irean HongJade J Sung, MD 03/15/16 [phone]

## 2016-03-24 ENCOUNTER — Other Ambulatory Visit: Payer: Self-pay | Admitting: Family Medicine

## 2016-05-13 ENCOUNTER — Ambulatory Visit: Payer: Medicaid Other | Admitting: Family Medicine

## 2016-06-25 ENCOUNTER — Encounter: Payer: Self-pay | Admitting: Family Medicine

## 2016-06-25 ENCOUNTER — Ambulatory Visit (INDEPENDENT_AMBULATORY_CARE_PROVIDER_SITE_OTHER): Payer: Medicaid Other | Admitting: Family Medicine

## 2016-06-25 VITALS — BP 116/76 | HR 68 | Temp 98.3°F | Resp 16 | Ht 66.0 in | Wt 308.0 lb

## 2016-06-25 DIAGNOSIS — H00013 Hordeolum externum right eye, unspecified eyelid: Secondary | ICD-10-CM | POA: Diagnosis not present

## 2016-06-25 DIAGNOSIS — L03313 Cellulitis of chest wall: Secondary | ICD-10-CM | POA: Diagnosis not present

## 2016-06-25 MED ORDER — CIPROFLOXACIN HCL 0.3 % OP SOLN: 2.0000 [drp] | OPHTHALMIC | 0 refills | Status: DC

## 2016-06-25 MED ORDER — AMOXICILLIN-POT CLAVULANATE 875-125 MG PO TABS: 1.0000 | ORAL_TABLET | Freq: Two times a day (BID) | ORAL | 0 refills | Status: DC

## 2016-06-25 NOTE — Patient Instructions (Signed)
Call for referral to eye doctor if lesion on eyelid is not improving by tomorrow morning

## 2016-06-25 NOTE — Progress Notes (Signed)
       Patient: Carrie LombardCierra M Jeanlouis Female    DOB: 12/18/1995   20 y.o.   MRN: [id number] Visit Date: 06/25/2016  Today's Provider: Mila Merryonald Fisher, MD   Chief Complaint  Patient presents with  . Eye Problem   Subjective:    HPI  Patient c/o stye in her right lower eye lid times 4 days. Patient reports discharge and blurred vision. Patient denies pain injury or pain.   Patient c/o cyst on left side of chest times 6 days. Patient denies fever, redness or discharge. Patient reports cyst is growing and is painful.    Allergies  Allergen Reactions  . Cefzil  [Cefprozil]      Current Outpatient Prescriptions:  .  HYDROcodone-acetaminophen (NORCO) 5-325 MG tablet, Take 1 tablet by mouth every 6 (six) hours as needed for moderate pain., Disp: 15 tablet, Rfl: 0 .  metFORMIN (GLUCOPHAGE-XR) 500 MG 24 hr tablet, TAKE 2 TABLETS BY MOUTH TWICE A DAY, Disp: 120 tablet, Rfl: 5 .  mupirocin cream (BACTROBAN) 2 %, Apply 1 application topically 2 (two) times daily., Disp: 15 g, Rfl: 0  Review of Systems  Constitutional: Negative.   Eyes: Positive for discharge, redness, itching and visual disturbance.  Cardiovascular: Negative.   Skin: Positive for rash.    Social History  Substance Use Topics  . Smoking status: Former Smoker    Quit date: 08/12/2013  . Smokeless tobacco: Never Used  . Alcohol use No   Objective:   BP 116/76 (BP Location: Left Arm, Patient Position: Sitting, Cuff Size: Large)   Pulse 68   Temp 98.3 F (36.8 C) (Oral)   Resp 16   Ht 5\' 6"  (1.676 m)   Wt (!) 308 lb (139.7 kg)   LMP 06/10/2016 (Exact Date)   SpO2 100%   BMI 49.71 kg/m   Physical Exam  General appearance: alert, well developed, well nourished, cooperative and in no distress Head: Normocephalic, without obvious abnormality, atraumatic Respiratory: Respirations even and unlabored, normal respiratory rate Eye: 8mm long and 3mm deep cystic lesion inner edge of loft lower eyelid. No discharge. No  inflammation of sclera or eyelid.  Skin: small slightly tender inflamed cyst mid chest left of midline.     Assessment & Plan:     1. Stye, right Discusses options of referral for I&D or trial of antibiotic drops. She would prefer drops. At this point no signs of infection of surround eye structures. Will start drops but she is to call if lesion worsens or if not improving within 24 hours.  - ciprofloxacin (CILOXAN) 0.3 % ophthalmic solution; Place 2 drops into the left eye every 4 (four) hours while awake.  Dispense: 5 mL; Refill: 0  2. Cellulitis of chest wall  - amoxicillin-clavulanate (AUGMENTIN) 875-125 MG tablet; Take 1 tablet by mouth 2 (two) times daily.  Dispense: 20 tablet; Refill: 0       Mila Merryonald Fisher, MD  [facility]

## 2016-06-26 ENCOUNTER — Telehealth: Payer: Self-pay | Admitting: Family Medicine

## 2016-06-26 DIAGNOSIS — H00019 Hordeolum externum unspecified eye, unspecified eyelid: Secondary | ICD-10-CM | POA: Insufficient documentation

## 2016-06-26 DIAGNOSIS — H00013 Hordeolum externum right eye, unspecified eyelid: Secondary | ICD-10-CM

## 2016-06-26 NOTE — Telephone Encounter (Signed)
-----   Message from Malva Limesonald E Fisher, MD sent at 06/25/2016  1:05 PM EDT ----- Regarding: FW: check with patient Friday to see how eye is doing   ----- Message ----- From: Malva Limesonald E Fisher, MD Sent: 06/25/2016   9:01 AM To: Malva Limesonald E Fisher, MD Subject: check with patient Friday to see how eye is #

## 2016-06-26 NOTE — Telephone Encounter (Signed)
Can you see if ophthalmologist can see patient today, she has large cystic mass on left lower eyelid.

## 2016-06-26 NOTE — Telephone Encounter (Signed)
Please advise patient that there are no ophthalmologist open this afternoon. If pain or swelling gets any worse she will need to go to ER. Otherwise continue using eye drops every 2-3 hours while awake during the weekend.

## 2016-06-26 NOTE — Telephone Encounter (Signed)
Pt stated that since speaking with the nurse this morning she does want to go ahead and get a referral for an eye doctor b/c she feels it has improved very little. Please advise. Thanks TNP

## 2016-06-26 NOTE — Telephone Encounter (Signed)
I called and spoke with patient. Patient states she has been using the eye drops and she feels like the stye is getting smaller. Patient does not think she needs to see an eye doctor. I advised patient to continue to use the eye drops as prescribed and to call us back if symptoms start to worsen. Patient verbally voiced understanding.

## 2016-06-26 NOTE — Telephone Encounter (Signed)
Please call patient and see if her eye is any better this morning. If not will need to get referral to see eye Doctor today.

## 2016-06-26 NOTE — Telephone Encounter (Signed)
Patient advised as below. Patient verbalizes understanding and is in agreement with treatment plan.  

## 2016-06-26 NOTE — Telephone Encounter (Signed)
Called [facility] and [facility] and they both closed at noon today.

## 2016-07-01 ENCOUNTER — Ambulatory Visit (INDEPENDENT_AMBULATORY_CARE_PROVIDER_SITE_OTHER): Payer: Medicaid Other | Admitting: Family Medicine

## 2016-07-01 ENCOUNTER — Encounter: Payer: Self-pay | Admitting: Family Medicine

## 2016-07-01 VITALS — BP 104/74 | HR 60 | Temp 98.7°F | Resp 16 | Wt 309.6 lb

## 2016-07-01 DIAGNOSIS — N760 Acute vaginitis: Secondary | ICD-10-CM

## 2016-07-01 LAB — POCT URINE PREGNANCY: PREG TEST UR: NEGATIVE

## 2016-07-01 MED ORDER — FLUCONAZOLE 150 MG PO TABS: 150.0000 mg | ORAL_TABLET | Freq: Once | ORAL | 1 refills | Status: AC

## 2016-07-01 NOTE — Progress Notes (Signed)
Subjective:     Patient ID: Carrie Garza, female   DOB: 05/22/1996, 20 y.o.   MRN: [id number]  HPI  Chief Complaint  Patient presents with  . Vaginitis    Patient comes in office today with complaints of vaginal itching internal/external patient reports that she "feels raw". Patient states that she has had symptoms for the past 4 days, she also complains of clear/brownish like discharge and musty vaginal odor.   Currently completing a course of Augmentin for an inflamed cyst. Reports she has a new sexual partner and is trying to get pregnant. LMP 8/30. Had a miscarriage earlier this year.Accompanied by her mother today.   Review of Systems     Objective:   Physical Exam  Constitutional: She appears well-developed and well-nourished. No distress.       Assessment:    1. Vaginitis - Chlamydia/Gonococcus/Trichomonas, NAA - POCT urine pregnancy - fluconazole (DIFLUCAN) 150 MG tablet; Take 1 tablet (150 mg total) by mouth once.  Dispense: 1 tablet; Refill: 1    Plan:    Further f/u pending lab work.

## 2016-07-01 NOTE — Patient Instructions (Signed)
We will call you with the lab results. 

## 2016-07-02 ENCOUNTER — Telehealth: Payer: Self-pay | Admitting: Family Medicine

## 2016-07-02 NOTE — Telephone Encounter (Signed)
Please advise results? 

## 2016-07-02 NOTE — Telephone Encounter (Signed)
Pt called for lab results. Labs were done yesterday 07/01/16. Please advise. Thanks TNP

## 2016-07-02 NOTE — Telephone Encounter (Signed)
Patient was notified.

## 2016-07-02 NOTE — Telephone Encounter (Signed)
Let her know the urine screen for STD''s takes a few days to get done.

## 2016-07-03 ENCOUNTER — Other Ambulatory Visit: Payer: Self-pay | Admitting: Family Medicine

## 2016-07-03 ENCOUNTER — Telehealth: Payer: Self-pay | Admitting: Family Medicine

## 2016-07-03 DIAGNOSIS — N760 Acute vaginitis: Secondary | ICD-10-CM

## 2016-07-03 LAB — CHLAMYDIA/GONOCOCCUS/TRICHOMONAS, NAA
CHLAMYDIA BY NAA: NEGATIVE
GONOCOCCUS BY NAA: NEGATIVE
TRICH VAG BY NAA: NEGATIVE

## 2016-07-03 MED ORDER — TERCONAZOLE 0.8 % VA CREA: 1.0000 | TOPICAL_CREAM | Freq: Every day | VAGINAL | 0 refills | Status: DC

## 2016-07-03 NOTE — Telephone Encounter (Signed)
-----   Message from Anola Gurneyobert Chauvin, [place] sent at 07/03/2016  1:48 PM EDT ----- No STD detected.

## 2016-07-03 NOTE — Telephone Encounter (Signed)
Pt called back stating that her phone cut out on her previous call. Pt is requesting her test results from 07/01/16. I advised pt of Bob's message from 07/02/16 to advise pt that the screenings take time. Pt stated that her symptoms are bothering her. I advised I would send back a message. I wasn't able to get more info about symptoms b/c pt either hung up or lost service. Please advise. Thanks TNP

## 2016-07-03 NOTE — Telephone Encounter (Signed)
Patient reports that she had picked up prescription same day of appt but it has not helped clear symptoms. KW

## 2016-07-03 NOTE — Telephone Encounter (Signed)
Attempted to contact patient number states that it is disconnected. Was able to reach patient by contacting her mother, patient was advised of labs. Patient reports that she has extreme vaginal itching and burning, and states that she is raw and has noticed that she has tiny bumps around vaginal area, patient states it is uncomfortable to wipe or void. Patient is requesting that you [facility]. KW

## 2016-07-03 NOTE — Telephone Encounter (Signed)
I had prescribed Diflucan-did she pick it up and take it?

## 2016-07-03 NOTE — Telephone Encounter (Signed)
Pt called for lab results from 2 days ago.  Her call back [phone].  Thanks Barth Kirkseri

## 2016-07-03 NOTE — Telephone Encounter (Signed)
Patient was advised. KW 

## 2016-07-03 NOTE — Telephone Encounter (Signed)
I have sent in a vaginal cream to be used for 3 nights.

## 2016-08-25 ENCOUNTER — Ambulatory Visit (INDEPENDENT_AMBULATORY_CARE_PROVIDER_SITE_OTHER): Payer: Medicaid Other | Admitting: Family Medicine

## 2016-08-25 ENCOUNTER — Encounter: Payer: Self-pay | Admitting: Family Medicine

## 2016-08-25 VITALS — BP 112/80 | HR 83 | Temp 98.6°F | Resp 16 | Ht 66.0 in | Wt 319.0 lb

## 2016-08-25 DIAGNOSIS — H9201 Otalgia, right ear: Secondary | ICD-10-CM

## 2016-08-25 DIAGNOSIS — N898 Other specified noninflammatory disorders of vagina: Secondary | ICD-10-CM | POA: Diagnosis not present

## 2016-08-25 LAB — POCT URINALYSIS DIPSTICK
Bilirubin, UA: NEGATIVE
GLUCOSE UA: NEGATIVE
Ketones, UA: NEGATIVE
LEUKOCYTES UA: NEGATIVE
NITRITE UA: NEGATIVE
PROTEIN UA: NEGATIVE
UROBILINOGEN UA: 0.2
pH, UA: 6

## 2016-08-25 MED ORDER — AZITHROMYCIN 500 MG PO TABS: ORAL_TABLET | ORAL | 0 refills | Status: DC

## 2016-08-25 MED ORDER — METRONIDAZOLE 500 MG PO TABS: 500.0000 mg | ORAL_TABLET | Freq: Three times a day (TID) | ORAL | 0 refills | Status: DC

## 2016-08-25 NOTE — Progress Notes (Signed)
Patient: Carrie LombardCierra M Garza Female    DOB: 11/19/1995   20 y.o.   MRN: [id number] Visit Date: 08/25/2016  Today's Provider: Mila Merryonald Aristea Posada, MD   Chief Complaint  Patient presents with  . Vaginitis  . Ear Pain   Subjective:    Patient has had burning while voiding. Also has symptoms of green vaginal discharge and occasional lower abdominal pain. Mild lower back pain, itching, and rash in vaginal area.   Vaginal Itching  The patient's primary symptoms include a genital odor, a genital rash, pelvic pain and vaginal discharge. The patient's pertinent negatives include no genital itching, genital lesions, missed menses or vaginal bleeding. This is a chronic problem. The current episode started more than 1 year ago (2 to 3 months). The problem occurs constantly. The problem has been gradually worsening. The pain is mild. The problem affects both sides. She is not pregnant. Associated symptoms include abdominal pain, back pain, frequency, headaches, painful intercourse, rash and urgency. Pertinent negatives include no anorexia, chills, constipation, diarrhea, discolored urine, dysuria, fever, flank pain, hematuria, joint pain, joint swelling, nausea, sore throat or vomiting. The vaginal discharge was green. There has been no bleeding. She has not been passing clots. She has not been passing tissue. Nothing aggravates the symptoms. Treatments tried: monostat. The treatment provided no relief. She is sexually active. It is unknown whether or not her partner has an STD. She uses nothing for contraception. Her menstrual history has been regular.   She also complains of right ear pain and ringing for the last 3 days.    Allergies  Allergen Reactions  . Cefzil  [Cefprozil]      Current Outpatient Prescriptions:  .  metFORMIN (GLUCOPHAGE-XR) 500 MG 24 hr tablet, TAKE 2 TABLETS BY MOUTH TWICE A DAY, Disp: 120 tablet, Rfl: 5  Review of Systems  Constitutional: Negative for appetite change, chills,  fatigue and fever.  HENT: Negative for sore throat.   Respiratory: Negative for chest tightness and shortness of breath.   Cardiovascular: Negative for chest pain and palpitations.  Gastrointestinal: Positive for abdominal pain. Negative for anorexia, constipation, diarrhea, nausea and vomiting.  Genitourinary: Positive for frequency, pelvic pain, urgency and vaginal discharge. Negative for dysuria, flank pain, hematuria and missed menses.  Musculoskeletal: Positive for back pain. Negative for joint pain.  Skin: Positive for rash.  Neurological: Positive for headaches. Negative for dizziness and weakness.    Social History  Substance Use Topics  . Smoking status: Former Smoker    Quit date: 08/12/2013  . Smokeless tobacco: Never Used  . Alcohol use No   Objective:   BP 112/80 (BP Location: Right Arm, Patient Position: Sitting, Cuff Size: Large)   Pulse 83   Temp 98.6 F (37 C) (Oral)   Resp 16   Ht 5\' 6"  (1.676 m)   Wt (!) 319 lb (144.7 kg)   SpO2 97%   BMI 51.49 kg/m   Physical Exam  General Appearance:    Alert, cooperative, no distress, obese  Eyes:    PERRL, conjunctiva/corneas clear, EOM's intact      Right TM dull and bulging.   Lungs:     Clear to auscultation bilaterally, respirations unlabored  Heart:    Regular rate and rhythm  Abdomen:   bowel sounds present and normal in all 4 quadrants, soft, round, nontender or nondistended. No CVA tenderness        Assessment & Plan:     1. Vaginal discharge Cover  for GC/Trich/Clamydia while awaiting labs.  - azithromycin (ZITHROMAX) 500 MG tablet; Take 4 (four) tablets at once with food  Dispense: 4 tablet; Refill: 0 - metroNIDAZOLE (FLAGYL) 500 MG tablet; Take 1 tablet (500 mg total) by mouth 3 (three) times daily.  Dispense: 21 tablet; Refill: 0 - Chlamydia/Gonococcus/Trichomonas, NAA - POCT urinalysis dipstick  2. Right ear pain Likely mild eustachian tube dysfunction versus early OM. She is to call if not  impoving after taking azithromycin.        Mila Merryonald Benedicto Capozzi, MD  [facility]

## 2016-08-27 ENCOUNTER — Telehealth: Payer: Self-pay

## 2016-08-27 LAB — CHLAMYDIA/GONOCOCCUS/TRICHOMONAS, NAA
Chlamydia by NAA: NEGATIVE
Gonococcus by NAA: NEGATIVE
Trich vag by NAA: NEGATIVE

## 2016-08-27 NOTE — Telephone Encounter (Signed)
Advised was advised of results.  She will take the  antibiotic and call back if not resolved.  Thank sTeri

## 2016-08-27 NOTE — Telephone Encounter (Signed)
[id number] 

## 2016-08-27 NOTE — Telephone Encounter (Signed)
-----   Message from Malva Limesonald E Fisher, MD sent at 08/27/2016  7:52 AM EST ----- Cultures are all negative. Most likely has bacterial vaginitis which should resolved with metronidazole that was prescribed. Call back if not cleared up when finished with antibiotic.

## 2016-12-09 ENCOUNTER — Ambulatory Visit (INDEPENDENT_AMBULATORY_CARE_PROVIDER_SITE_OTHER): Payer: Medicaid Other | Admitting: Family Medicine

## 2016-12-09 ENCOUNTER — Encounter: Payer: Self-pay | Admitting: Family Medicine

## 2016-12-09 VITALS — BP 118/68 | HR 72 | Temp 98.6°F | Resp 16 | Ht 66.0 in | Wt 311.0 lb

## 2016-12-09 DIAGNOSIS — Z3201 Encounter for pregnancy test, result positive: Secondary | ICD-10-CM | POA: Diagnosis not present

## 2016-12-09 DIAGNOSIS — Z113 Encounter for screening for infections with a predominantly sexual mode of transmission: Secondary | ICD-10-CM

## 2016-12-09 DIAGNOSIS — E119 Type 2 diabetes mellitus without complications: Secondary | ICD-10-CM | POA: Diagnosis not present

## 2016-12-09 DIAGNOSIS — Z3A01 Less than 8 weeks gestation of pregnancy: Secondary | ICD-10-CM

## 2016-12-09 LAB — POCT GLYCOSYLATED HEMOGLOBIN (HGB A1C)
ESTIMATED AVERAGE GLUCOSE: 123
HEMOGLOBIN A1C: 5.9

## 2016-12-09 LAB — POCT URINE PREGNANCY: PREG TEST UR: POSITIVE — AB

## 2016-12-09 NOTE — Progress Notes (Signed)
Patient: Carrie Garza Female    DOB: 1995/10/18   21 y.o.   MRN: [id number] Visit Date: 12/09/2016  Today's Provider: Mila Merry, MD   Chief Complaint  Patient presents with  . Possible Pregnancy   Subjective:    HPI  Possible pregnancy: Patient reports that she took a home pregnancy test and it was positive. She reports that her LMP was around 11/10/2016. However, she reports that she had 2 menstrual cycles in the month of January.    Diabetes Mellitus Type II, Follow-up:   Lab Results  Component Value Date   HGBA1C 5.9 12/09/2016   HGBA1C 5.8 11/14/2015   HGBA1C 6.6 (A) 02/01/2015    Last seen for diabetes 1 years ago.  Management since then includes no changes. She reports good compliance with treatment. She is not having side effects.  Current symptoms include none and have been stable. Home blood sugar records: not being checked  Episodes of hypoglycemia? no   Current Insulin Regimen: none Most Recent Eye Exam: never Weight trend: stable Prior visit with dietician: no Current diet: well balanced Current exercise: none  Pertinent Labs:    Component Value Date/Time   CHOL 115 02/01/2015   TRIG 145 02/01/2015   HDL 30 (A) 02/01/2015   LDLCALC 56 02/01/2015   CREATININE 0.71 03/05/2016 1056   CREATININE 0.90 02/03/2012 0119    Wt Readings from Last 3 Encounters:  12/09/16 (!) 311 lb (141.1 kg)  08/25/16 (!) 319 lb (144.7 kg)  07/01/16 (!) 309 lb 9.6 oz (140.4 kg)          Allergies  Allergen Reactions  . Cefzil  [Cefprozil]      Current Outpatient Prescriptions:  .  metFORMIN (GLUCOPHAGE-XR) 500 MG 24 hr tablet, TAKE 2 TABLETS BY MOUTH TWICE A DAY, Disp: 120 tablet, Rfl: 5 .  azithromycin (ZITHROMAX) 500 MG tablet, Take 4 (four) tablets at once with food (Patient not taking: Reported on 12/09/2016), Disp: 4 tablet, Rfl: 0 .  metroNIDAZOLE (FLAGYL) 500 MG tablet, Take 1 tablet (500 mg total) by mouth 3 (three) times daily. (Patient  not taking: Reported on 12/09/2016), Disp: 21 tablet, Rfl: 0  Review of Systems  Constitutional: Negative.   Respiratory: Negative.   Cardiovascular: Negative.   Gastrointestinal: Positive for nausea.  Endocrine: Negative.   Psychiatric/Behavioral: Negative.     Social History  Substance Use Topics  . Smoking status: Former Smoker    Quit date: 08/12/2013  . Smokeless tobacco: Never Used  . Alcohol use No   Objective:   BP 118/68 (BP Location: Right Arm, Patient Position: Sitting, Cuff Size: Large)   Pulse 72   Temp 98.6 F (37 C)   Resp 16   Ht 5\' 6"  (1.676 m)   Wt (!) 311 lb (141.1 kg)   LMP 11/10/2016 (Approximate) Comment: patient reports she had 2 periods last month  BMI 50.20 kg/m   Physical Exam  General Appearance:    Alert, cooperative, no distress, obese  Eyes:    PERRL, conjunctiva/corneas clear, EOM's intact       Lungs:     Clear to auscultation bilaterally, respirations unlabored  Heart:    Regular rate and rhythm  Neurologic:   Awake, alert, oriented x 3. No apparent focal neurological           defect.       Results for orders placed or performed in visit on 12/09/16  POCT glycosylated hemoglobin (Hb A1C)  Result Value Ref Range   Hemoglobin A1C 5.9    Est. average glucose Bld gHb Est-mCnc 123   POCT urine pregnancy  Result Value Ref Range   Preg Test, Ur Positive (A) Negative       Assessment & Plan:     1. Less than [redacted] weeks gestation of pregnancy  - Ambulatory referral to Obstetrics / Gynecology  2. Type 2 diabetes mellitus without complication, without long-term current use of insulin (HCC)  - POCT glycosylated hemoglobin (Hb A1C)  3. Positive pregnancy test  - POCT urine pregnancy  4. Screening for STD (sexually transmitted disease)  - GC/chlamydia probe amp, urine     The entirety of the information documented in the History of Present Illness, Review of Systems and Physical Exam were personally obtained by me. Portions of this  information were initially documented by Anson Oregonachelle Presley, CMA and reviewed by me for thoroughness and accuracy.    Mila Merryonald Fisher, MD  [facility]

## 2016-12-09 NOTE — Addendum Note (Signed)
Addended by: Verdis PrimePRESLEY, Donasia Wimes L on: 12/09/2016 03:56 PM   Modules accepted: Orders

## 2016-12-11 LAB — CHLAMYDIA/GC NAA, CONFIRMATION
Chlamydia trachomatis, NAA: NEGATIVE
NEISSERIA GONORRHOEAE, NAA: NEGATIVE

## 2016-12-15 ENCOUNTER — Telehealth: Payer: Self-pay

## 2016-12-15 NOTE — Telephone Encounter (Signed)
Patient advised as below. Patient verbalizes understanding and is in agreement with treatment plan.  

## 2016-12-15 NOTE — Telephone Encounter (Signed)
Please review. Thanks!  

## 2016-12-15 NOTE — Telephone Encounter (Signed)
Can take OTC milk of magnesia prn and a daily stool softener (colace)

## 2016-12-15 NOTE — Telephone Encounter (Signed)
Pt c/o hard and painful stool x's 2 days. Patient denies blood on stools or abdominal pain. Patient is requesting advise on what she can take due to her pregnancy. Patient reports she is taking her metformin and started pre-natal vitamins. CB# [phone]  Please advise. Thank you. sd

## 2017-01-06 ENCOUNTER — Encounter: Payer: Self-pay | Admitting: Advanced Practice Midwife

## 2017-01-06 ENCOUNTER — Ambulatory Visit (INDEPENDENT_AMBULATORY_CARE_PROVIDER_SITE_OTHER): Payer: Medicaid Other | Admitting: Advanced Practice Midwife

## 2017-01-06 VITALS — BP 122/74 | Wt 316.0 lb

## 2017-01-06 DIAGNOSIS — Z1379 Encounter for other screening for genetic and chromosomal anomalies: Secondary | ICD-10-CM

## 2017-01-06 DIAGNOSIS — Z6841 Body Mass Index (BMI) 40.0 and over, adult: Secondary | ICD-10-CM | POA: Insufficient documentation

## 2017-01-06 DIAGNOSIS — Z124 Encounter for screening for malignant neoplasm of cervix: Secondary | ICD-10-CM

## 2017-01-06 DIAGNOSIS — O0990 Supervision of high risk pregnancy, unspecified, unspecified trimester: Secondary | ICD-10-CM | POA: Insufficient documentation

## 2017-01-06 DIAGNOSIS — Z3687 Encounter for antenatal screening for uncertain dates: Secondary | ICD-10-CM

## 2017-01-06 DIAGNOSIS — Z113 Encounter for screening for infections with a predominantly sexual mode of transmission: Secondary | ICD-10-CM

## 2017-01-06 DIAGNOSIS — O99210 Obesity complicating pregnancy, unspecified trimester: Secondary | ICD-10-CM | POA: Insufficient documentation

## 2017-01-06 NOTE — Patient Instructions (Signed)

## 2017-01-06 NOTE — Progress Notes (Signed)
NOB today. COP at [facility].

## 2017-01-07 NOTE — Progress Notes (Signed)
Here for NOB. Will discuss with MD regarding need to transfer care due to BMI

## 2017-01-07 NOTE — Progress Notes (Signed)
New Obstetric Patient H&P    Chief Complaint: "Desires prenatal care"   History of Present Illness: Patient is a 21 y.o. G1P0000 Not Hispanic or Latino female, LMP 11/10/2016 presents with amenorrhea and positive home pregnancy test. Based on her  LMP, her EDD is 08/17/2017. Cycles are 4. days, regular, and occur approximately every : 28 days. Her last pap smear was 3 years ago and was no abnormalities.    She had a urine pregnancy test which was positive 2 week(s)  ago. Her last menstrual period was normal and lasted for  2 day(s). Since her LMP she claims she has experienced breast tenderness, fatigue, nausea. She denies vaginal bleeding. Her past medical history is obesity bmi 51 today, medication controlled diabetes. This is her first pregnancy.  Since her LMP, she admits to the use of tobacco products  no She claims she has gained  10 pounds since the start of her pregnancy.  There are cats in the home in the home  no  She admits close contact with children on a regular basis  yes  She has had chicken pox in the past yes She has had Tuberculosis exposures, symptoms, or previously tested positive for TB   no Current or past history of domestic violence. no  Genetic Screening/Teratology Counseling: (Includes patient, baby's father, or anyone in either family with:)   1. Patient's age >/= 56 at [facility]  no 2. Thalassemia ([place], [place], Mediterranean, or Asian background): MCV<80  no 3. Neural tube defect (meningomyelocele, spina bifida, anencephaly)  no 4. Congenital heart defect  no  5. Down syndrome  no 6. Tay-Sachs (Jewish, [place]  no 7. Canavan's Disease  no 8. Sickle cell disease or trait (African)  no  9. Hemophilia or other blood disorders  no  10. Muscular dystrophy  no  11. Cystic fibrosis  no  12. Huntington's Chorea  no  13. Mental retardation/autism  no 14. Other inherited genetic or chromosomal disorder  no 15. Maternal metabolic disorder (DM, PKU,  etc)  no 16. Patient or FOB with a child with a birth defect not listed above no  16a. Patient or FOB with a birth defect themselves no 17. Recurrent pregnancy loss, or stillbirth  no  18. Any medications since LMP other than prenatal vitamins (include vitamins, supplements, OTC meds, drugs, alcohol)  no 19. Any other genetic/environmental exposure to discuss  no  Infection History:   1. Lives with someone with TB or TB exposed  no  2. Patient or partner has history of genital herpes  no 3. Rash or viral illness since LMP  no 4. History of STI (GC, CT, HPV, syphilis, HIV)  yes 5. History of recent travel :  no  Other pertinent information:  no     Review of Systems:10 point review of systems negative unless otherwise noted in HPI  Past Medical History:  Past Medical History:  Diagnosis Date  . ADHD (attention deficit hyperactivity disorder)   . Asthma   . Diabetes mellitus without complication (HCC)   . Elevated blood pressure   . History of chicken pox   . Knee fracture, left 02/2003  . Obesity   . Polycystic ovarian syndrome     Past Surgical History:  Past Surgical History:  Procedure Laterality Date  . LABIOPLASTY  2008   hypertrophy B labia. Toya Smothers  . TONSILLECTOMY AND ADENOIDECTOMY  2001    Gynecologic History: Patient's last menstrual period was 11/10/2016 (  approximate).  Obstetric History: G1P0000  Family History:  Family History  Problem Relation Age of Onset  . Diabetes Mother   . Hypertension Mother   . Asthma Mother   . Obesity Mother   . Hypertension Maternal Grandmother   . Prostate cancer Maternal Grandfather   . Hypertension Maternal Grandfather   . Diabetes Other     type 2  . Stroke Other     Social History:  Social History   Social History  . Marital status: Single    Spouse name: N/A  . Number of children: 0  . Years of education: N/A   Occupational History  . Not on file.   Social History Main Topics  . Smoking  status: Former Smoker    Quit date: 08/12/2013  . Smokeless tobacco: Never Used  . Alcohol use No  . Drug use: No  . Sexual activity: Yes    Partners: Male     Comment: 2+ lifetime partners. sporadic condom use. Chlamydia 2010.    Other Topics Concern  . Not on file   Social History Narrative  . No narrative on file    Allergies:  Allergies  Allergen Reactions  . Cefzil  [Cefprozil]     Medications: Prior to Admission medications   Medication Sig Start Date End Date Taking? Authorizing Provider  metFORMIN (GLUCOPHAGE-XR) 500 MG 24 hr tablet TAKE 2 TABLETS BY MOUTH TWICE A DAY 03/25/16  Yes Malva Limes, MD  Prenatal Vit-Fe Fumarate-FA (PRENATAL VITAMIN PO) Take by mouth.   Yes Historical Provider, MD  azithromycin (ZITHROMAX) 500 MG tablet Take 4 (four) tablets at once with food Patient not taking: Reported on 12/09/2016 08/25/16   Malva Limes, MD  metroNIDAZOLE (FLAGYL) 500 MG tablet Take 1 tablet (500 mg total) by mouth 3 (three) times daily. Patient not taking: Reported on 12/09/2016 08/25/16   Malva Limes, MD    Physical Exam Vitals: Blood pressure 122/74, weight (!) 316 lb (143.3 kg), last menstrual period 11/10/2016.  General: NAD HEENT: normocephalic, anicteric Thyroid: no enlargement, no palpable nodules Pulmonary: No increased work of breathing, CTAB Cardiovascular: RRR, distal pulses 2+ Abdomen: NABS, soft, non-tender, non-distended.  Umbilicus without lesions.  No hepatomegaly, splenomegaly or masses palpable. No evidence of hernia  Genitourinary:  External: Normal external female genitalia.  Normal urethral meatus, normal  Bartholin's and Skene's glands.    Vagina: Normal vaginal mucosa, no evidence of prolapse.    Cervix: Grossly normal in appearance, no bleeding  Uterus: not evaluated due to body habitus  Adnexa: not evaluated  Rectal: deferred Extremities: no edema, erythema, or tenderness Neurologic: Grossly intact Psychiatric: mood  appropriate, affect full   Assessment: 21 y.o. G1P0000 at [redacted]w[redacted]d per LMP presenting to initiate prenatal care  Plan: 1) Avoid alcoholic beverages. 2) Patient encouraged not to smoke.  3) Discontinue the use of all non-medicinal drugs and chemicals.  4) Take prenatal vitamins daily.  5) Nutrition, food safety (fish, cheese advisories, and high nitrite foods) and exercise discussed. 6) Hospital and practice style discussed with cross coverage system.  7) Genetic Screening, such as with 1st Trimester Screening, cell free fetal DNA, AFP testing, and Ultrasound, as well as with amniocentesis and CVS as appropriate, is discussed with patient. At the conclusion of today's visit patient requested genetic testing 8) Patient is asked about travel to areas at risk for the Zika virus, and counseled to avoid travel and exposure to mosquitoes or sexual partners who may have themselves been exposed  to the virus. Testing is discussed, and will be ordered as appropriate.  9) Discuss with MD regarding need to transfer pt to more appropriate level of care due to elevated BMI   Tresea MallJane Lakeisha Waldrop, CNM

## 2017-01-08 LAB — RPR+RH+ABO+RUB AB+AB SCR+CB...
Antibody Screen: NEGATIVE
HEMATOCRIT: 38.7 % (ref 34.0–46.6)
HEMOGLOBIN: 12.9 g/dL (ref 11.1–15.9)
HEP B S AG: NEGATIVE
HIV Screen 4th Generation wRfx: NONREACTIVE
MCH: 27.5 pg (ref 26.6–33.0)
MCHC: 33.3 g/dL (ref 31.5–35.7)
MCV: 83 fL (ref 79–97)
Platelets: 308 10*3/uL (ref 150–379)
RBC: 4.69 x10E6/uL (ref 3.77–5.28)
RDW: 15.4 % (ref 12.3–15.4)
RH TYPE: POSITIVE
RPR Ser Ql: NONREACTIVE
RUBELLA: 4.19 {index} (ref >0.99)
Varicella zoster IgG: >4000 index (ref >165)
WBC: 9.5 10*3/uL (ref 3.4–10.8)

## 2017-01-08 LAB — HEMOGLOBINOPATHY EVALUATION
HEMOGLOBIN A2 QUANTITATION: 2.2 % (ref 1.8–3.2)
HGB C: 0 %
HGB S: 0 %
HGB VARIANT: 0 %
Hemoglobin F Quantitation: 0 % (ref 0.0–2.0)
Hgb A: 97.8 % (ref 96.4–98.8)

## 2017-01-08 LAB — IGP,CTNGTV,RFX APTIMA HPV ASCU
Chlamydia, Nuc. Acid Amp: NEGATIVE
Gonococcus, Nuc. Acid Amp: NEGATIVE
PAP SMEAR COMMENT: 0
TRICH VAG BY NAA: NEGATIVE

## 2017-01-08 LAB — URINE CULTURE

## 2017-01-14 ENCOUNTER — Encounter: Payer: Self-pay | Admitting: Certified Nurse Midwife

## 2017-01-14 ENCOUNTER — Ambulatory Visit (INDEPENDENT_AMBULATORY_CARE_PROVIDER_SITE_OTHER): Payer: Medicaid Other

## 2017-01-14 ENCOUNTER — Ambulatory Visit (INDEPENDENT_AMBULATORY_CARE_PROVIDER_SITE_OTHER): Payer: Medicaid Other | Admitting: Certified Nurse Midwife

## 2017-01-14 VITALS — BP 122/82 | HR 63 | Wt 316.0 lb

## 2017-01-14 DIAGNOSIS — Z139 Encounter for screening, unspecified: Secondary | ICD-10-CM

## 2017-01-14 DIAGNOSIS — Z3687 Encounter for antenatal screening for uncertain dates: Secondary | ICD-10-CM | POA: Diagnosis not present

## 2017-01-14 DIAGNOSIS — O99211 Obesity complicating pregnancy, first trimester: Secondary | ICD-10-CM

## 2017-01-14 DIAGNOSIS — Z3A08 8 weeks gestation of pregnancy: Secondary | ICD-10-CM

## 2017-01-14 DIAGNOSIS — O0990 Supervision of high risk pregnancy, unspecified, unspecified trimester: Secondary | ICD-10-CM

## 2017-01-14 DIAGNOSIS — Z6841 Body Mass Index (BMI) 40.0 and over, adult: Secondary | ICD-10-CM

## 2017-01-14 DIAGNOSIS — O0991 Supervision of high risk pregnancy, unspecified, first trimester: Secondary | ICD-10-CM

## 2017-01-14 DIAGNOSIS — E119 Type 2 diabetes mellitus without complications: Secondary | ICD-10-CM

## 2017-01-14 MED ORDER — FOLIC ACID 1 MG PO TABS: 1.0000 mg | ORAL_TABLET | Freq: Every day | ORAL | 1 refills | Status: DC

## 2017-01-14 NOTE — Progress Notes (Signed)
Dating scan today. CRL 8wk6d, matching LMP dates. FHR 144. Patient high risk due to obesity (BMI=51 after losing 67#) and diabetes. Last hemoglobin A1C was 5.9% at [facility]. Is not checking her blood sugars. Taking Metformin m XL BID per patient.  Will consult [facility] regarding diet and monitoring blood glucose. TSH, CMP, hemoglobin A1C today. NOB labs reviewed with patient and are normal. Will consult [facility] for plan of management and eventually transfer patient to tertiary center with MFM. Patient advised of plan. Desires genetic testing. ROB and NT in 4 weeks.

## 2017-01-14 NOTE — Addendum Note (Signed)
Addended by: Farrel Conners on: 01/14/2017 10:55 AM   Modules accepted: Orders

## 2017-01-15 LAB — HEMOGLOBIN A1C
Est. average glucose Bld gHb Est-mCnc: 120 mg/dL
Hgb A1c MFr Bld: 5.8 % — ABNORMAL HIGH (ref 4.8–5.6)

## 2017-01-15 LAB — COMPREHENSIVE METABOLIC PANEL
ALK PHOS: 55 IU/L (ref 39–117)
ALT: 29 IU/L (ref 0–32)
AST: 28 IU/L (ref 0–40)
Albumin/Globulin Ratio: 1.7 (ref 1.2–2.2)
Albumin: 4.7 g/dL (ref 3.5–5.5)
BUN/Creatinine Ratio: 9 (ref 9–23)
BUN: 6 mg/dL (ref 6–20)
Bilirubin Total: 0.5 mg/dL (ref 0.0–1.2)
CO2: 19 mmol/L (ref 18–29)
CREATININE: 0.66 mg/dL (ref 0.57–1.00)
Calcium: 9.7 mg/dL (ref 8.7–10.2)
Chloride: 101 mmol/L (ref 96–106)
GFR calc Af Amer: 144 mL/min/{1.73_m2} (ref >59)
GFR, EST NON AFRICAN AMERICAN: 125 mL/min/{1.73_m2} (ref >59)
GLUCOSE: 66 mg/dL (ref 65–99)
Globulin, Total: 2.7 g/dL (ref 1.5–4.5)
Potassium: 3.9 mmol/L (ref 3.5–5.2)
Sodium: 140 mmol/L (ref 134–144)
Total Protein: 7.4 g/dL (ref 6.0–8.5)

## 2017-01-15 LAB — TSH: TSH: 1.04 u[IU]/mL (ref 0.450–4.500)

## 2017-01-18 ENCOUNTER — Ambulatory Visit: Payer: Medicaid Other | Admitting: *Deleted

## 2017-01-25 ENCOUNTER — Ambulatory Visit: Payer: Medicaid Other | Admitting: *Deleted

## 2017-01-28 ENCOUNTER — Ambulatory Visit: Admission: RE | Admit: 2017-01-28 | Payer: Medicaid Other | Source: Ambulatory Visit

## 2017-02-04 ENCOUNTER — Ambulatory Visit
Admission: RE | Admit: 2017-02-04 | Discharge: 2017-02-04 | Disposition: A | Discharge status: Home / Self Care | Payer: Medicaid Other | Source: Ambulatory Visit | Attending: Maternal & Fetal Medicine | Admitting: Maternal & Fetal Medicine

## 2017-02-04 VITALS — BP 133/73 | HR 78 | Temp 98.3°F | Resp 18 | Wt 316.2 lb

## 2017-02-04 DIAGNOSIS — O99211 Obesity complicating pregnancy, first trimester: Secondary | ICD-10-CM | POA: Insufficient documentation

## 2017-02-04 DIAGNOSIS — Z3A11 11 weeks gestation of pregnancy: Secondary | ICD-10-CM | POA: Diagnosis not present

## 2017-02-04 DIAGNOSIS — Z881 Allergy status to other antibiotic agents status: Secondary | ICD-10-CM | POA: Insufficient documentation

## 2017-02-04 DIAGNOSIS — Z7984 Long term (current) use of oral hypoglycemic drugs: Secondary | ICD-10-CM | POA: Diagnosis not present

## 2017-02-04 DIAGNOSIS — Z833 Family history of diabetes mellitus: Secondary | ICD-10-CM | POA: Insufficient documentation

## 2017-02-04 DIAGNOSIS — E669 Obesity, unspecified: Secondary | ICD-10-CM | POA: Insufficient documentation

## 2017-02-04 DIAGNOSIS — O24119 Pre-existing diabetes mellitus, type 2, in pregnancy, unspecified trimester: Secondary | ICD-10-CM

## 2017-02-04 DIAGNOSIS — Z6841 Body Mass Index (BMI) 40.0 and over, adult: Secondary | ICD-10-CM | POA: Diagnosis not present

## 2017-02-04 DIAGNOSIS — O24111 Pre-existing diabetes mellitus, type 2, in pregnancy, first trimester: Secondary | ICD-10-CM | POA: Insufficient documentation

## 2017-02-04 DIAGNOSIS — E119 Type 2 diabetes mellitus without complications: Secondary | ICD-10-CM | POA: Diagnosis present

## 2017-02-04 DIAGNOSIS — Z8051 Family history of malignant neoplasm of kidney: Secondary | ICD-10-CM | POA: Insufficient documentation

## 2017-02-04 LAB — GLUCOSE, CAPILLARY: GLUCOSE-CAPILLARY: 88 mg/dL (ref 65–99)

## 2017-02-04 NOTE — Progress Notes (Signed)
MFM Consult  21 yo G1 LMP 10/3016 and EDD 08/17/17 at [place] 1 day by LMP. She is referred for consultation due to Type 2 DM for 13 years.  She denies history of end organ damage and has been managed on metformin.  Her history is also significant for PCOS, asthma (per record), and ADHD (per records) She is accompanied today by her brother.    Past Medical History Type 2 DM since age 60--diagnosed via blood testing PCOS ADHD (per record) Asthma (per record) PCOS  Past Surgical History:  Procedure Laterality Date  . LABIOPLASTY  2008   hypertrophy B labia. Georga Bora  . TONSILLECTOMY AND ADENOIDECTOMY  2001   Left knee surgery, as child following fall/trauma  Allergies  Allergen Reactions  . Cefzil  [Cefprozil]    Current Outpatient Prescriptions on File Prior to Encounter  Medication Sig Dispense Refill  . folic acid (FOLVITE) 1 MG tablet Take 1 tablet (1 mg total) by mouth daily. 90 tablet 1  . metFORMIN (GLUCOPHAGE-XR) 500 MG 24 hr tablet TAKE 2 TABLETS BY MOUTH TWICE A DAY 120 tablet 5  . Prenatal Vit-Fe Fumarate-FA (PRENATAL VITAMIN PO) Take by mouth.    Marland Kitchen azithromycin (ZITHROMAX) 500 MG tablet Take 4 (four) tablets at once with food (Patient not taking: Reported on 12/09/2016) 4 tablet 0   No current facility-administered medications on file prior to encounter.     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   Family History +diabetes (mother, brother) Her mother died 2 month ago from kidney cancer   BSUS today single live iup   1. Type 2 Diabetes  She is currently taking Metformin still and was advised she may require insulin as pregnancy progresses due to possible increase in insulin resistance.   We discussed the effects of her diabetes on the pregnancy and risks for conditions such as preeclampsia, need for operative delivery.  We also addressed the risk for  congenital anomalies.   Her HgbA1c was 5.9% in the first trimester so I counseled her that her risk was elevated above baseline but that her good blood glucose control prior to pregnancy reduced her risk for fetal birth defects.  We addressed the risk for fetal macrosomia/fetal growth disturbances, stillbirth and neonatal metabolic complications in the setting of poor glycemic control.  We discussed increasing diabetes medications (possible need for insulin) and increase in insulin requirements with advancing pregnancy and the possible  need for frequent adjustment of insulin dosages to meet these needs.   1. Fasting a capillary blood glucose below 95 mg/dl; 2. Two hour postprandial capillary blood glucoses less than 120 mg/dl.  Recommend low dose 'baby' aspirin daily  after 12 weeks to reduce the risk for preeclampsia.    In addition, we have the following recommendations:  1. Baseline EKG; 2. Baseline P: C ratio, CBC (normal), and CMP(normal)  3. Ophthalmological examination 4. Detailed fetal anatomic survey between 16-[redacted] weeks gestation 5. Fetal echocardiogram ~[redacted] weeks gestation;  6. Ultrasound for fetal growth every 4 weeks beginning at [redacted] weeks gestation;  7. Weekly nonstress tests beginning at [redacted] weeks gestation and twice weekly after 34 weeks 8. Delivery planning will be determine once the patient is closer to her estimated due date. This will be determined by:    A. Fetal size;    B. Amniotic fluid volume;    C. Level of glucose control;    D. Other  medical or obstetrical complications.  She was also given a glucose log book to record blood glucose values.   2. BMI>50--we addressed concerns associated with obesity in pregnancy such as risks for preeclampsia.  Risks for congenital anomalies, stillbirth, and macrosomia were reviewed.  Recommendations for fetal assessment and surveillance are as outlined above.  In addition: --she was counseled to aim for less than 15lb weight gain.   --She has met with a  nutritionist.  --she is taking an additional 3m folic acid daily --TSH screening negative --recommend ECG and maternal Echocardiogram  [facility]  We are happy to continue to see her in consultation. Please advise when you would like transfer of care to MFM to occur, [facility].  Her 18w detailed anatomic [facility] care [facility] there, if desired.     Time: 372m

## 2017-02-11 ENCOUNTER — Ambulatory Visit (INDEPENDENT_AMBULATORY_CARE_PROVIDER_SITE_OTHER): Payer: Medicaid Other

## 2017-02-11 ENCOUNTER — Ambulatory Visit (INDEPENDENT_AMBULATORY_CARE_PROVIDER_SITE_OTHER): Payer: Medicaid Other | Admitting: Obstetrics and Gynecology

## 2017-02-11 VITALS — BP 114/76 | Wt 323.0 lb

## 2017-02-11 DIAGNOSIS — O0991 Supervision of high risk pregnancy, unspecified, first trimester: Secondary | ICD-10-CM

## 2017-02-11 DIAGNOSIS — O24119 Pre-existing diabetes mellitus, type 2, in pregnancy, unspecified trimester: Secondary | ICD-10-CM

## 2017-02-11 DIAGNOSIS — O99210 Obesity complicating pregnancy, unspecified trimester: Secondary | ICD-10-CM

## 2017-02-11 DIAGNOSIS — E119 Type 2 diabetes mellitus without complications: Secondary | ICD-10-CM

## 2017-02-11 DIAGNOSIS — Z139 Encounter for screening, unspecified: Secondary | ICD-10-CM | POA: Diagnosis not present

## 2017-02-11 DIAGNOSIS — O0990 Supervision of high risk pregnancy, unspecified, unspecified trimester: Secondary | ICD-10-CM

## 2017-02-11 DIAGNOSIS — Z3A12 12 weeks gestation of pregnancy: Secondary | ICD-10-CM

## 2017-02-11 HISTORY — DX: Pre-existing type 2 diabetes mellitus, in pregnancy, unspecified trimester: O24.119

## 2017-02-11 NOTE — Progress Notes (Signed)
NT screen today.

## 2017-02-11 NOTE — Patient Instructions (Signed)
Start low dose ASA (81mg ) at >[redacted] weeks gestation as per USPTF recommendation "Low-Dose Aspirin Use for the Prevention of Morbidity and Mortality From Preeclampsia: Preventive Medicine"  furthermore endorsed by ACOG, WHO, and NIH based on evidence level B for the prevention of preeclampsia  In women deemed high risk  (diabetes, renal disease, chronic hypertension, history of preeclampsia in prior gestation, autoimmune diseases, or multifetal gestations)

## 2017-02-11 NOTE — Progress Notes (Signed)
Routine Prenatal Care Visit  Subjective  Fetal Movement? no Contractions? no Leaking Fluid? no Vaginal Bleeding? no  Objective   Vitals:   02/11/17 0908  BP: 114/76    @WEIGHTCHANGE @ Urine dipstick shows negative for all components.  General: NAD Pumonary: no increased work of breathing Abdomen: gravid, non-tender,+FHT on [place] Extremities: no edema Psychiatric: mood appropriate, affect full   Assessment   21 y.o. G2P0010 at [redacted]w[redacted]d by  08/20/2017, by Ultrasound presenting for routine prenatal visit  pregnancy Problems (from 01/06/17 to present)    No problems associated with this episode.       Plan   Problem List Items Addressed This Visit      Endocrine   Pre-existing type 2 diabetes mellitus during pregnancy, antepartum   Relevant Orders   Ambulatory referral to Obstetrics / Gynecology     Other   Supervision of high risk pregnancy, antepartum   Obesity affecting pregnancy, antepartum   Relevant Orders   Ambulatory referral to Obstetrics / Gynecology    Other Visit Diagnoses    [redacted] weeks gestation of pregnancy    -  Primary   Relevant Orders   Ambulatory referral to Obstetrics / Gynecology   Supervision of high risk pregnancy in first trimester       Relevant Orders   Ambulatory referral to Obstetrics / Gynecology     1)  Start low dose ASA at >[redacted] weeks gestation as per USPTF recommendation "Low-Dose Aspirin Use for the Prevention of Morbidity and Mortality From Preeclampsia: Preventive Medicine"  furthermore endorsed by ACOG, WHO, and NIH based on evidence level B for the prevention of preeclampsia  In women deemed high risk  (diabetes, renal disease, chronic hypertension, history of preeclampsia in prior gestation, autoimmune diseases, or multifetal gestations)  2) Unable to obtain NT measurement today - plan on quad screen in second trimester  3) DMII - fetal echo after [redacted] weeks gestation, continue metformin.  Gestational diabetes  - Discussed ADA  diet, in addition for obese patient diagnosed prior to 20 weeks recommend caloric restriction to 25 Kcal/kg/day ideal body weight.  Carbohydrates should be limited to 33-40% of daily caloric intake with attention given to high glycemic index vs low glycemic index foods.  The remaining calories should be split approximately 20% protein, 40% fats.  Three meals a day with two snacks to spread carbohdyrate load was recommended. - Recommend lifestyles referral for more in depth diet teaching - Home glucose monitoring 4 times a day (AM fasting goal <100, 2-hr postprandial <120).  Importance in maintaining glucose log discussed as proper management will rely on assessing home BG readings.  Stressed team approach with patient being a vital member of this team in ensuring best possible outcome for her fetus. - If good control twice weekly follow up appopriate - Antepartum testing indicated at 32-34 weeks if on meds for glycemic control, monthly growth scan starting at 28 weeks - For diet controlled gestational diabetes induction by 40 weeks if favorable cervix, delivery at latest by 41 weeks - Evidence of fetal macrosomia or poor control may necessitate earlier delivery, 39 weeks or less.  If fetal weight >4500g counseling regarding risk of shoulder dystocia and consideration of C-section discussed. - If pharmacotherapy should need to be started insulin remains the ADA recommended treatment.  Metformin may be used but this indication is not FDA approved, it readily crosses the placenta and while 2-year follow up data have not shown significant developmental differences long term follow  up data is unavailable.  Glyburide may be used as a second line oral agent but is not superior to insulin in preventing macrosomia, may be associated with increased rates of neonatal hypyoglycemia, and is not FDA approved for this indication.  - Stressed that diabetes is a common complicating factor in pregnancy affecting about 7% of  pregnancies - Managed properly and with patient adherence to monitoring, diet and lifestyle changes does not have to be associated with adverse pregnancy outcome.  However, uncontrolled may lead to adverse outcomes including still birth, shoulder dystocia, need for cesarean delivery. - Increased risk of preeclampsia in patient diagnosed with gestational diabetes discussed - Increased lifetime risk of develop Type II diabetes discussed  ACOG Practive Bulletin 190 "Gestatioal Diabetes" Updated Februrary 2018  4) Morbid obesity - discussed transfer options including [facility], [facility] and [facility]  Patient opts for [facility] transfer request sent

## 2017-02-15 ENCOUNTER — Other Ambulatory Visit: Payer: Self-pay | Admitting: Certified Nurse Midwife

## 2017-03-04 ENCOUNTER — Ambulatory Visit: Payer: Medicaid Other

## 2017-03-11 ENCOUNTER — Ambulatory Visit (INDEPENDENT_AMBULATORY_CARE_PROVIDER_SITE_OTHER): Payer: Medicaid Other | Admitting: Certified Nurse Midwife

## 2017-03-11 VITALS — BP 126/74 | Wt 320.0 lb

## 2017-03-11 DIAGNOSIS — O0992 Supervision of high risk pregnancy, unspecified, second trimester: Secondary | ICD-10-CM

## 2017-03-11 DIAGNOSIS — Z3A16 16 weeks gestation of pregnancy: Secondary | ICD-10-CM

## 2017-03-11 DIAGNOSIS — Z1379 Encounter for other screening for genetic and chromosomal anomalies: Secondary | ICD-10-CM

## 2017-03-11 DIAGNOSIS — E119 Type 2 diabetes mellitus without complications: Secondary | ICD-10-CM

## 2017-03-11 DIAGNOSIS — Z6841 Body Mass Index (BMI) 40.0 and over, adult: Secondary | ICD-10-CM

## 2017-03-11 MED ORDER — HYDROCORTISONE 1 % EX OINT: 1.0000 "application " | TOPICAL_OINTMENT | Freq: Two times a day (BID) | CUTANEOUS | 0 refills | Status: DC

## 2017-03-11 NOTE — Progress Notes (Signed)
Pt reports no problems. Aware of [facility] [place] for u/s and MFM consult. States blood sugars have been good. Did not bring glucose log.

## 2017-03-11 NOTE — Progress Notes (Signed)
FH at U-2. FHTs 150 Complaining of itching on right breast and vulva. Recently changed body wash. Excoriated area at 11-12 o'clock outside the areola Vulva: Labia majora appears irritated Vagina: white discharge Wet prep negative for hyphae, Trich and clue cells. No increased WBCs Pruritis possibly due to new body wash RX for HC ointment 1% BID to pruritic areas Reports fasting blood sugars in the 80s, 2 hr pp values less than 120 (did not bring log) [facility] MFM consult and transfer. Quad screen drawn today. ROB in 4 weeks if needed.

## 2017-03-15 ENCOUNTER — Telehealth: Payer: Self-pay | Admitting: Certified Nurse Midwife

## 2017-03-15 NOTE — Telephone Encounter (Signed)
Left message to call me about quad screen results.

## 2017-03-16 ENCOUNTER — Other Ambulatory Visit: Payer: Self-pay | Admitting: Certified Nurse Midwife

## 2017-03-16 ENCOUNTER — Telehealth: Payer: Self-pay | Admitting: Certified Nurse Midwife

## 2017-03-16 DIAGNOSIS — O289 Unspecified abnormal findings on antenatal screening of mother: Secondary | ICD-10-CM

## 2017-03-16 DIAGNOSIS — O285 Abnormal chromosomal and genetic finding on antenatal screening of mother: Secondary | ICD-10-CM

## 2017-03-16 NOTE — Telephone Encounter (Signed)
Patient called and advised of abnormal first trimester results with increased risk of Trisomy 18 of 1:59. Will come in tomorrow to have Informaseq and is already scheduled for anatomy scan with [facility] this week. Will inform [facility] of abnormal results also.

## 2017-03-16 NOTE — Telephone Encounter (Signed)
PAtient called and advised of test results. See telephone call documentation.

## 2017-03-16 NOTE — Telephone Encounter (Signed)
Pt is calling back to do to missed call .

## 2017-03-17 ENCOUNTER — Other Ambulatory Visit: Payer: Medicaid Other

## 2017-03-17 DIAGNOSIS — O289 Unspecified abnormal findings on antenatal screening of mother: Secondary | ICD-10-CM

## 2017-03-18 NOTE — Telephone Encounter (Signed)
Faxed lab results to [facility]

## 2017-03-22 ENCOUNTER — Telehealth: Payer: Self-pay

## 2017-03-22 LAB — AFP TETRA
DIA MOM VALUE: 0.61
DIA VALUE (EIA): 69.37 pg/mL
DSR (By Age)    1 IN: 1081
DSR (Second Trimester) 1 IN: 3958
GESTATIONAL AGE AFP: 16.6 wk
MATERNAL AGE AT EDD: 23.7 a
MSAFP Mom: 1.23
MSAFP: 25.5 ng/mL
MSHCG MOM: 0.49
MSHCG: 10597 m[IU]/mL
Osb Risk: 3538
T18 (By Age): 1:4212 {titer}
Test Results:: POSITIVE — AB
Tri 18 Risk At Term: 1:59 {titer}
UE3 MOM: 0.36
UE3 VALUE: 0.27 ng/mL
Weight: 311 [lb_av]

## 2017-03-22 NOTE — Telephone Encounter (Signed)
Pat c LC calling to be sure we were aware of elevated AFP on pt - specimen # [id number].  If questions, call [phone] opt 2, than opt 3. [facility] that we have report.

## 2017-03-23 LAB — INFORMASEQ(SM) WITH XY ANALYSIS
Fetal Fraction (%):: 3.6
Fetal Number: 1
Gestational Age at Collection: 18.1 weeks
Weight: 320 [lb_av]

## 2017-04-08 ENCOUNTER — Encounter: Payer: Medicaid Other | Admitting: Obstetrics and Gynecology

## 2017-04-09 ENCOUNTER — Other Ambulatory Visit: Payer: Self-pay | Admitting: Family Medicine

## 2017-04-16 DIAGNOSIS — O09299 Supervision of pregnancy with other poor reproductive or obstetric history, unspecified trimester: Secondary | ICD-10-CM

## 2017-04-16 HISTORY — DX: Supervision of pregnancy with other poor reproductive or obstetric history, unspecified trimester: O09.299

## 2017-08-04 ENCOUNTER — Observation Stay
Admission: EM | Admit: 2017-08-04 | Discharge: 2017-08-04 | Disposition: A | Discharge status: Other Acute Inpatient Hospital | Payer: Medicaid Other | Attending: Obstetrics and Gynecology | Admitting: Obstetrics and Gynecology

## 2017-08-04 DIAGNOSIS — E119 Type 2 diabetes mellitus without complications: Secondary | ICD-10-CM | POA: Diagnosis not present

## 2017-08-04 DIAGNOSIS — O358XX0 Maternal care for other (suspected) fetal abnormality and damage, not applicable or unspecified: Secondary | ICD-10-CM | POA: Insufficient documentation

## 2017-08-04 DIAGNOSIS — O99213 Obesity complicating pregnancy, third trimester: Secondary | ICD-10-CM | POA: Diagnosis not present

## 2017-08-04 DIAGNOSIS — Z7982 Long term (current) use of aspirin: Secondary | ICD-10-CM | POA: Diagnosis not present

## 2017-08-04 DIAGNOSIS — O24113 Pre-existing diabetes mellitus, type 2, in pregnancy, third trimester: Secondary | ICD-10-CM

## 2017-08-04 DIAGNOSIS — Z3A37 37 weeks gestation of pregnancy: Secondary | ICD-10-CM | POA: Insufficient documentation

## 2017-08-04 DIAGNOSIS — Z79899 Other long term (current) drug therapy: Secondary | ICD-10-CM | POA: Insufficient documentation

## 2017-08-04 DIAGNOSIS — Z794 Long term (current) use of insulin: Secondary | ICD-10-CM | POA: Diagnosis not present

## 2017-08-04 DIAGNOSIS — Z888 Allergy status to other drugs, medicaments and biological substances status: Secondary | ICD-10-CM | POA: Diagnosis not present

## 2017-08-04 DIAGNOSIS — O9989 Other specified diseases and conditions complicating pregnancy, childbirth and the puerperium: Secondary | ICD-10-CM | POA: Diagnosis present

## 2017-08-04 DIAGNOSIS — O133 Gestational [pregnancy-induced] hypertension without significant proteinuria, third trimester: Principal | ICD-10-CM | POA: Insufficient documentation

## 2017-08-04 HISTORY — DX: Gestational (pregnancy-induced) hypertension without significant proteinuria, unspecified trimester: O13.9

## 2017-08-04 LAB — CBC
HEMATOCRIT: 41 % (ref 35.0–47.0)
HEMOGLOBIN: 13.9 g/dL (ref 12.0–16.0)
MCH: 28.5 pg (ref 26.0–34.0)
MCHC: 33.8 g/dL (ref 32.0–36.0)
MCV: 84.6 fL (ref 80.0–100.0)
Platelets: 221 10*3/uL (ref 150–440)
RBC: 4.85 MIL/uL (ref 3.80–5.20)
RDW: 14.5 % (ref 11.5–14.5)
WBC: 8.3 10*3/uL (ref 3.6–11.0)

## 2017-08-04 LAB — URINE DRUG SCREEN, QUALITATIVE (ARMC ONLY)
AMPHETAMINES, UR SCREEN: NOT DETECTED
BARBITURATES, UR SCREEN: NOT DETECTED
BENZODIAZEPINE, UR SCRN: NOT DETECTED
Cannabinoid 50 Ng, Ur ~~LOC~~: POSITIVE — AB
Cocaine Metabolite,Ur ~~LOC~~: NOT DETECTED
MDMA (Ecstasy)Ur Screen: NOT DETECTED
METHADONE SCREEN, URINE: NOT DETECTED
OPIATE, UR SCREEN: NOT DETECTED
PHENCYCLIDINE (PCP) UR S: NOT DETECTED
Tricyclic, Ur Screen: NOT DETECTED

## 2017-08-04 LAB — COMPREHENSIVE METABOLIC PANEL
ALK PHOS: 103 U/L (ref 38–126)
ALT: 20 U/L (ref 14–54)
ANION GAP: 9 (ref 5–15)
AST: 21 U/L (ref 15–41)
Albumin: 3.3 g/dL — ABNORMAL LOW (ref 3.5–5.0)
BILIRUBIN TOTAL: 0.7 mg/dL (ref 0.3–1.2)
BUN: 10 mg/dL (ref 6–20)
CALCIUM: 9.7 mg/dL (ref 8.9–10.3)
CO2: 18 mmol/L — ABNORMAL LOW (ref 22–32)
Chloride: 108 mmol/L (ref 101–111)
Creatinine, Ser: 0.66 mg/dL (ref 0.44–1.00)
GLUCOSE: 79 mg/dL (ref 65–99)
POTASSIUM: 4 mmol/L (ref 3.5–5.1)
Sodium: 135 mmol/L (ref 135–145)
TOTAL PROTEIN: 7.6 g/dL (ref 6.5–8.1)

## 2017-08-04 LAB — PROTEIN / CREATININE RATIO, URINE
CREATININE, URINE: 232 mg/dL
PROTEIN CREATININE RATIO: 0.26 mg/mg{creat} — AB (ref 0.00–0.15)
TOTAL PROTEIN, URINE: 60 mg/dL

## 2017-08-04 NOTE — OB Triage Note (Signed)
Nitrazine questionable

## 2017-08-04 NOTE — Final Progress Note (Signed)
Physician Final Progress Note  Patient ID: Carrie LombardCierra M Adelsberger MRN: [id number] DOB/AGE: 23/11/1995 23 y.o.  Admit date: 08/04/2017 Admitting provider: Vena AustriaAndreas Izzabell Klasen, MD Discharge date: 08/04/2017   Admission Diagnoses: Lost mucous plug  Discharge Diagnoses:  Active Problems:   Labor and delivery indication for care or intervention  Gestational hypertension  21 y.o. G2P0010 at 7863w5d presenting to L&D with complaints of loosing mucous plug.  Evaluation revealed cervix to be 1/50/-3, no evidence of prelabor rupture of membranes.  [facility] to a morbid obesity, type II DM currently on insulin, quad screen positive for T18 with no further genetic work up per the patient's wishes, and fetal cardiac abnormalities.  She was noted to have elevated mild range blood pressures, with prior BPs this pregnancy being normal.  She was asymptomatic and labs were obtained and returned without abnormalities other than THC positive UDS.  Given GHTN at term and significant fetal cardiac abnormalities [facility]    Consults: None  Significant Findings/ Diagnostic Studies:  Results for orders placed or performed during the hospital encounter of 08/04/17 (from the past 24 hour(s))  Protein / creatinine ratio, urine     Status: Abnormal   Collection Time: 08/04/17 10:12 AM  Result Value Ref Range   Creatinine, Urine 232 mg/dL   Total Protein, Urine 60 mg/dL   Protein Creatinine Ratio 0.26 (H) 0.00 - 0.15 mg/mg[Cre]  Urine Drug Screen, Qualitative ([facility] only)     Status: Abnormal   Collection Time: 08/04/17 10:12 AM  Result Value Ref Range   Tricyclic, Ur Screen NONE DETECTED NONE DETECTED   Amphetamines, Ur Screen NONE DETECTED NONE DETECTED   MDMA (Ecstasy)Ur Screen NONE DETECTED NONE DETECTED   Cocaine Metabolite,Ur [place] NONE DETECTED NONE DETECTED   Opiate, Ur Screen NONE DETECTED NONE DETECTED   Phencyclidine (PCP) Ur S NONE DETECTED NONE  DETECTED   Cannabinoid 50 Ng, Ur [place] POSITIVE (A) NONE DETECTED   Barbiturates, Ur Screen NONE DETECTED NONE DETECTED   Benzodiazepine, Ur Scrn NONE DETECTED NONE DETECTED   Methadone Scn, Ur NONE DETECTED NONE DETECTED  Comprehensive metabolic panel     Status: Abnormal   Collection Time: 08/04/17 10:24 AM  Result Value Ref Range   Sodium 135 135 - 145 mmol/L   Potassium 4.0 3.5 - 5.1 mmol/L   Chloride 108 101 - 111 mmol/L   CO2 18 (L) 22 - 32 mmol/L   Glucose, Bld 79 65 - 99 mg/dL   BUN 10 6 - 20 mg/dL   Creatinine, Ser 7.820.66 0.44 - 1.00 mg/dL   Calcium 9.7 8.9 - 95.610.3 mg/dL   Total Protein 7.6 6.5 - 8.1 g/dL   Albumin 3.3 (L) 3.5 - 5.0 g/dL   AST 21 15 - 41 U/L   ALT 20 14 - 54 U/L   Alkaline Phosphatase 103 38 - 126 U/L   Total Bilirubin 0.7 0.3 - 1.2 mg/dL   GFR calc non Af Amer >60 >60 mL/min   GFR calc Af Amer >60 >60 mL/min   Anion gap 9 5 - 15  CBC     Status: None   Collection Time: 08/04/17 10:24 AM  Result Value Ref Range   WBC 8.3 3.6 - 11.0 K/uL   RBC 4.85 3.80 - 5.20 MIL/uL   Hemoglobin 13.9 12.0 - 16.0 g/dL   HCT 21.341.0 08.635.0 - 57.847.0 %   MCV 84.6 80.0 - 100.0 fL   MCH 28.5 26.0 -  34.0 pg   MCHC 33.8 32.0 - 36.0 g/dL   RDW 40.9 81.1 - 91.4 %   Platelets 221 150 - 440 K/uL     Procedures:  Baseline: 140 Variability: moderate Accelerations: present Decelerations: absent Tocometry: irregular, every 8-10 minutes The patient was monitored for 30 minutes, fetal heart rate tracing was deemed reactive, category I tracing,  CPT [id number]   Discharge Condition: stable  Disposition: 01-Home or Self Care  Diet: Diabetic diet  Discharge Activity: Activity as tolerated   Allergies as of 08/04/2017      Reactions   Cefzil  [cefprozil]       Medication List    TAKE these medications   aspirin EC 81 MG tablet Take 81 mg by mouth daily.   folic acid 1 MG tablet Commonly known as:  FOLVITE Take 1 tablet (1 mg total) by mouth daily.   hydrocortisone 1 %  ointment Apply 1 application topically 2 (two) times daily.   insulin regular 100 units/mL injection Commonly known as:  NOVOLIN R,HUMULIN R Inject into the skin 2 (two) times daily before a meal.   metFORMIN 500 MG 24 hr tablet Commonly known as:  GLUCOPHAGE-XR TAKE 2 TABLETS BY MOUTH TWICE A DAY   PRENATAL VITAMIN PO Take by mouth.        Total time spent taking care of this patient: 45 minutes  Signed: Vena Austria 08/04/2017, 11:46 AM

## 2017-08-04 NOTE — Discharge Summary (Signed)
See final progress note. 

## 2017-08-04 NOTE — H&P (Signed)
Obstetric H&P   Chief Complaint: "Lost mucous plug"  [facility]  History of Present Illness: 21 y.o. G2P0010 3753w5d by 08/20/2017, by Ultrasound presenting to L&D with complains of having lost her mucous plug this morning.  Irregular contractions, no LOF, no VB, +FM. On presentation blood pressures noted to be consistently mild range, denies prior blood pressure issues preceding pregnancy or antepartum this pregnancy.  She denies headaches, visual changes, RUQ or epigastric pain.  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  [facility]   Review of Systems: 10 point review of systems negative unless otherwise noted in HPI  Past Medical History: Past Medical History:  Diagnosis Date  . ADHD (attention deficit hyperactivity disorder)   . Asthma   . Diabetes mellitus without complication (HCC)   . Elevated blood pressure   . History of chicken pox   . Knee fracture, left 02/2003  . Obesity   . Polycystic ovarian syndrome   . Pregnancy induced hypertension     Past Surgical History: Past Surgical History:  Procedure Laterality Date  . LABIOPLASTY  2008   hypertrophy B labia. Toya SmothersKnowles Jonas  . TONSILLECTOMY AND ADENOIDECTOMY  2001   Family History: Family History  Problem Relation Age of Onset  . Diabetes Mother   . Hypertension Mother   . Asthma Mother   . Obesity Mother   . Hypertension Maternal Grandmother   . Prostate cancer Maternal Grandfather   . Hypertension Maternal Grandfather   . Diabetes Other        type 2  . Stroke Other     Social History: Social History   Social History  . Marital status: Single    Spouse name: N/A  . Number of children: 0  . Years of education: N/A   Occupational History  . Not on file.    Social History Main Topics  . Smoking status: Former Smoker    Quit date: 08/12/2013  . Smokeless tobacco: Never Used  . Alcohol use No  . Drug use: No  . Sexual activity: Yes    Partners: Male     Comment: 2+ lifetime partners. sporadic condom use. Chlamydia 2010.    Other Topics Concern  . Not on file   Social History Narrative  . No narrative on file    Medications: Prior to Admission medications   Medication Sig Start Date End Date Taking? Authorizing Provider  aspirin EC 81 MG tablet Take 81 mg by mouth daily.   Yes [provider]  folic acid (FOLVITE) 1 MG tablet Take 1 tablet (1 mg total) by mouth daily. 01/14/17  Yes Farrel ConnersGutierrez, Colleen, CNM  insulin regular (NOVOLIN R,HUMULIN R) 100 units/mL injection Inject into the skin 2 (two) times daily before a meal.   Yes [provider]  Prenatal Vit-Fe Fumarate-FA (PRENATAL VITAMIN PO) Take by mouth.   Yes [provider]  hydrocortisone 1 % ointment Apply 1 application topically 2 (two) times daily. Patient not taking: Reported on 08/04/2017 03/11/17   Farrel ConnersGutierrez, Colleen, CNM  metFORMIN (GLUCOPHAGE-XR) 500 MG 24 hr tablet TAKE 2 TABLETS BY MOUTH TWICE A DAY Patient not taking: Reported on 08/04/2017 04/10/17   Malva LimesFisher, Donald E, MD    Allergies: Allergies  Allergen Reactions  . Cefzil  [Cefprozil]  Physical Exam: Vitals: Blood pressure (!) 136/94, pulse 77, temperature 98.1 F (36.7 C), temperature source Oral, resp. rate 18, height 5\' 6"  (1.676 m), weight (!) 323 lb (146.5 kg), last menstrual period 11/10/2016, SpO2 99 %.  Urine Dip Protein: pending  FHT: 130, moderate, +accels, no decels Toco: irregular  General: NAD HEENT: normocephalic, anicteric Pulmonary: No increased work of breathing Cardiovascular: RRR, distal pulses 2+ Abdomen: Gravid, non-tender Genitourinary: 1/50/-3, no pooling, negative nitrazine, and negative ferning Extremities: no edema, erythema, or  tenderness Neurologic: Grossly intact Psychiatric: mood appropriate, affect full  Labs: No results found for this or any previous visit (from the past 24 hour(s)).  Assessment: 21 y.o. G2P0010 [redacted]w[redacted]d by 08/20/2017, by Ultrasound presenting with chief complaint of lost mucous plug and noted to have elevated blood pressures on presentation  Plan: 1) Elevated blood pressures - consistently mild range and asymptomatic. - Check labs - serial BP's - Discussed that recommendation is to proceed with delivery post 37 weeks, given the fetal cardiac abnormalities I discussed that recommendation would also be for this to be done at [facility] were there is a treatment plan in place for the baby  2) Fetus - cat I tracing  3) Disposition - labs pending, called [facility] to arrange transfer

## 2017-08-04 NOTE — Discharge Summary (Signed)
[facility] called L&D RN. Report given, and was told they would arrive approximately around 1315. [facility] L&D RN [facility]15. Will conitnue to monitor patient until transfer.

## 2017-08-04 NOTE — OB Triage Note (Signed)
Pt presents c/o loosing her mucous plug with watery discharge around 7 this morning. [facility] high BP, Diabetes, and high BMI. PT denies any NVD, or bleeding. Reports positive fetal movement.

## 2017-09-13 IMAGING — CR DG CHEST 2V
1 series · 2 of 2 positions shown · non-contrast
Comparison: 02/03/2012

CLINICAL DATA: Cough

EXAM:
CHEST  2 VIEW

[Series 1: dg chest 2 view · 0.14mm/px · 2 of 2 slices shown]
[im 1/2]
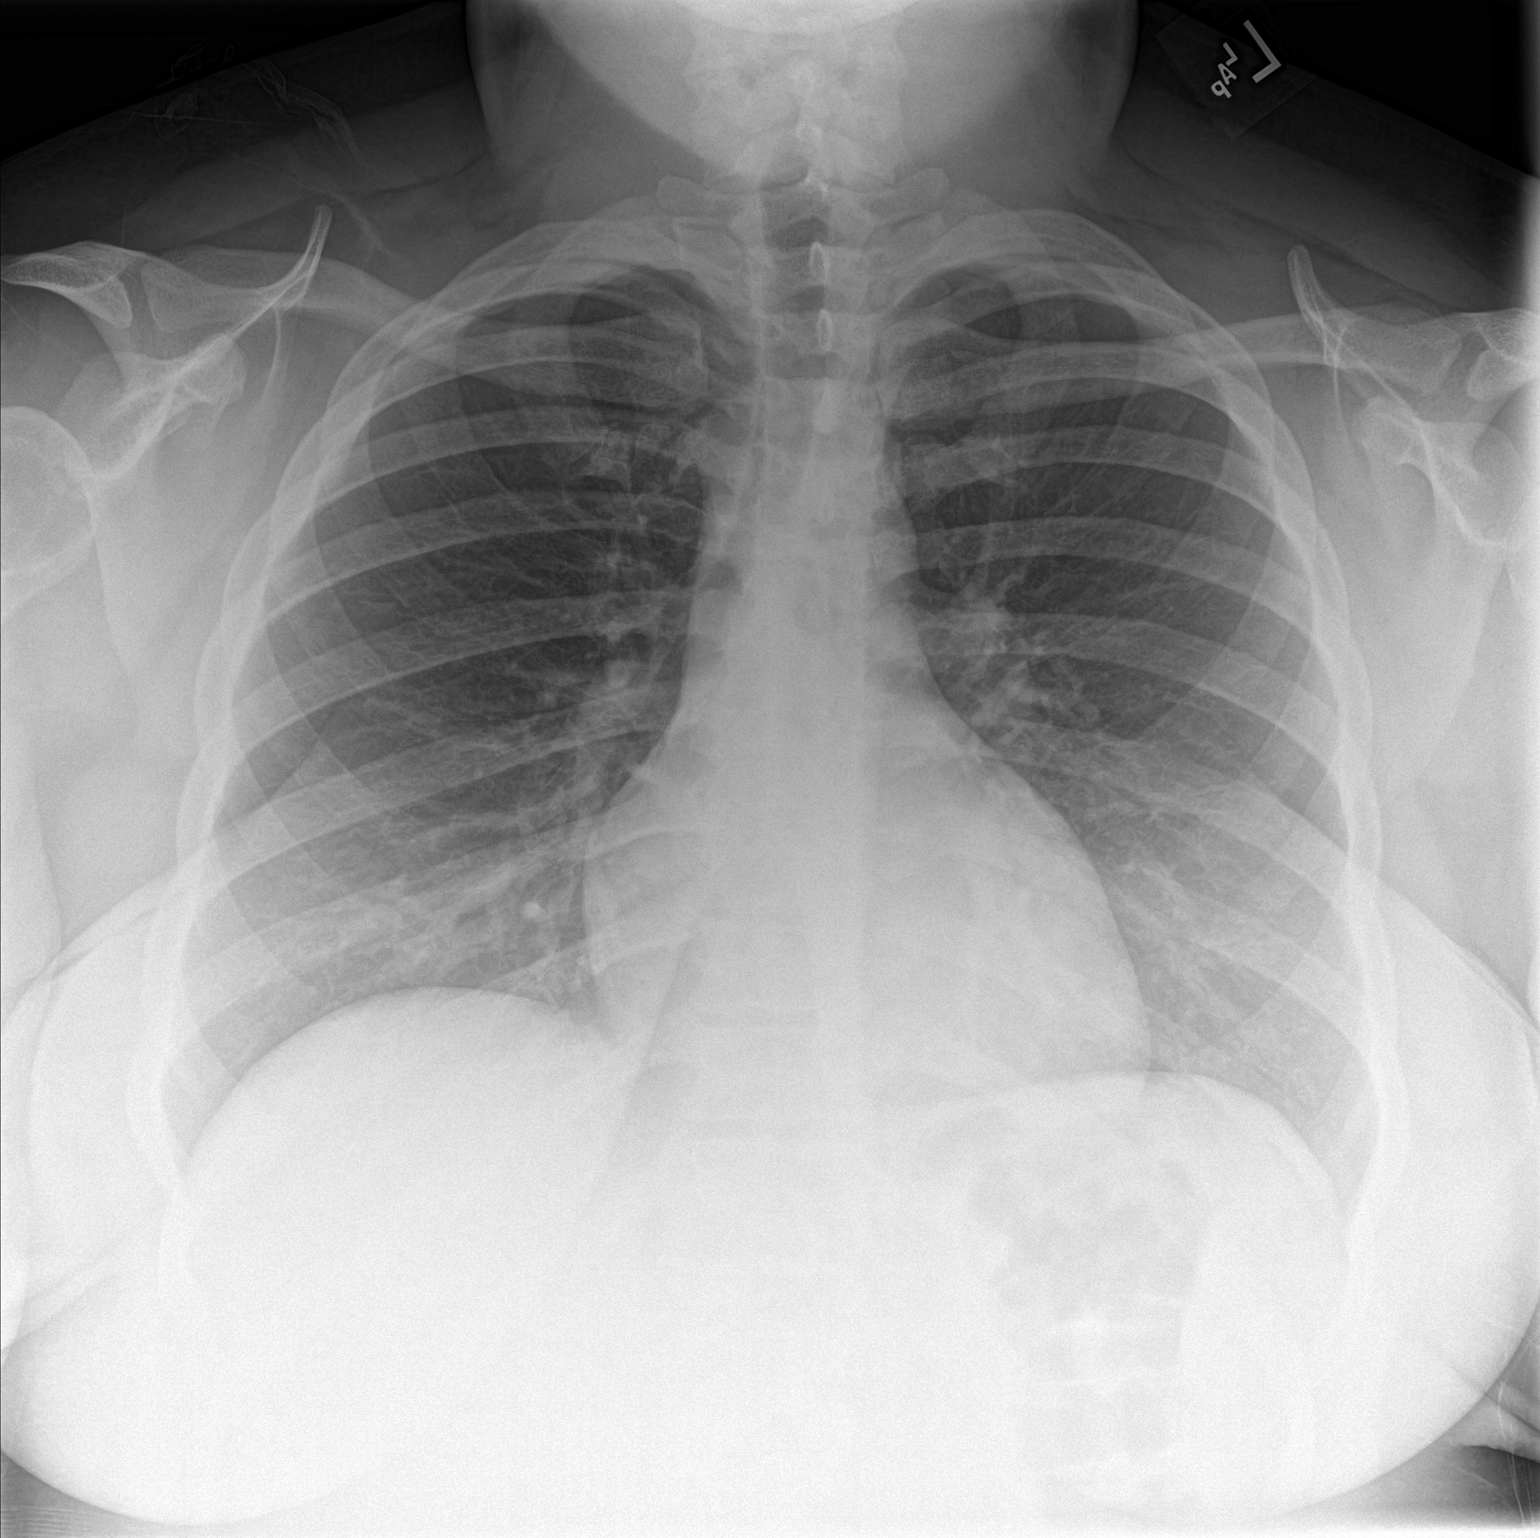
[im 2/2]
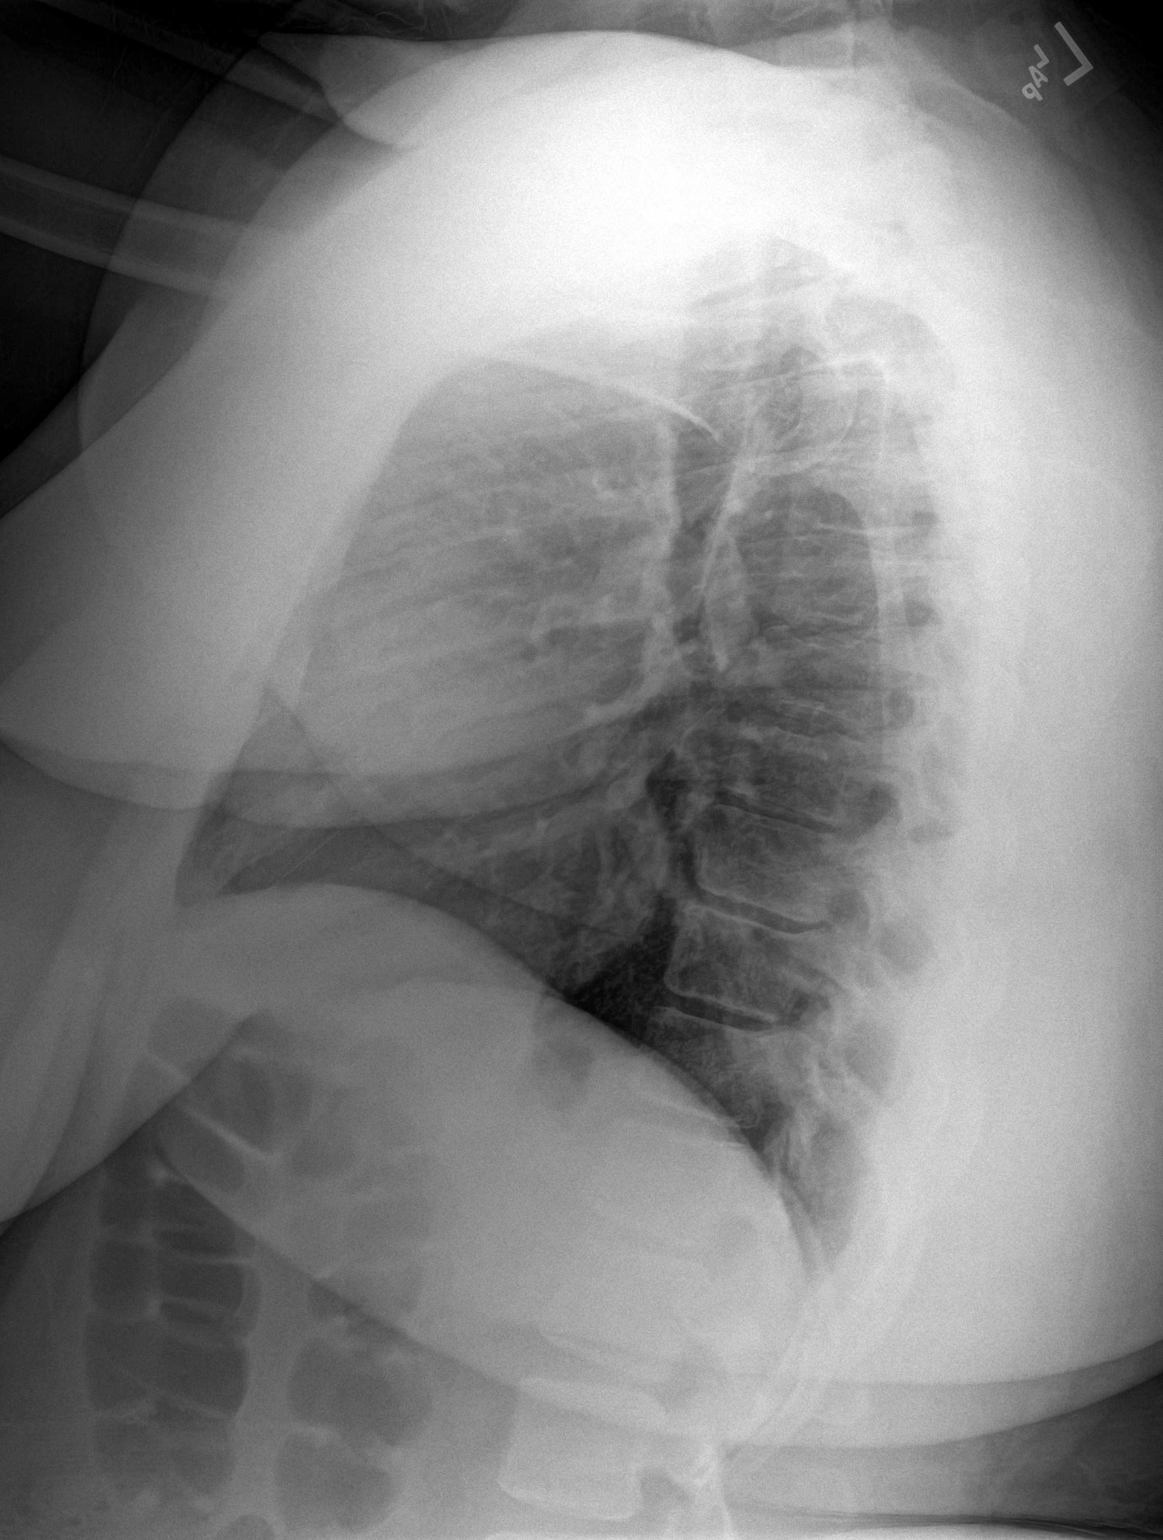

[2 of 2 positions shown; findings below may reference images not displayed]

FINDINGS: The heart size and mediastinal contours are within normal limits.
Both lungs are clear. The visualized skeletal structures are
unremarkable.
IMPRESSION: No active cardiopulmonary disease.

## 2017-11-12 ENCOUNTER — Encounter (HOSPITAL_COMMUNITY): Payer: Self-pay

## 2018-07-04 ENCOUNTER — Observation Stay
Admission: EM | Admit: 2018-07-04 | Discharge: 2018-07-04 | Disposition: A | Discharge status: Home / Self Care | Payer: Medicaid Other | Attending: Obstetrics and Gynecology | Admitting: Obstetrics and Gynecology

## 2018-07-04 ENCOUNTER — Telehealth: Payer: Self-pay

## 2018-07-04 ENCOUNTER — Other Ambulatory Visit: Payer: Self-pay

## 2018-07-04 DIAGNOSIS — B373 Candidiasis of vulva and vagina: Secondary | ICD-10-CM | POA: Diagnosis not present

## 2018-07-04 DIAGNOSIS — Z87891 Personal history of nicotine dependence: Secondary | ICD-10-CM | POA: Insufficient documentation

## 2018-07-04 DIAGNOSIS — E119 Type 2 diabetes mellitus without complications: Secondary | ICD-10-CM | POA: Insufficient documentation

## 2018-07-04 DIAGNOSIS — O0990 Supervision of high risk pregnancy, unspecified, unspecified trimester: Secondary | ICD-10-CM

## 2018-07-04 DIAGNOSIS — Z6841 Body Mass Index (BMI) 40.0 and over, adult: Secondary | ICD-10-CM

## 2018-07-04 DIAGNOSIS — O99213 Obesity complicating pregnancy, third trimester: Secondary | ICD-10-CM | POA: Insufficient documentation

## 2018-07-04 DIAGNOSIS — Z8759 Personal history of other complications of pregnancy, childbirth and the puerperium: Secondary | ICD-10-CM | POA: Insufficient documentation

## 2018-07-04 DIAGNOSIS — Z3A31 31 weeks gestation of pregnancy: Secondary | ICD-10-CM | POA: Insufficient documentation

## 2018-07-04 DIAGNOSIS — O98813 Other maternal infectious and parasitic diseases complicating pregnancy, third trimester: Principal | ICD-10-CM | POA: Insufficient documentation

## 2018-07-04 DIAGNOSIS — O24119 Pre-existing diabetes mellitus, type 2, in pregnancy, unspecified trimester: Secondary | ICD-10-CM

## 2018-07-04 DIAGNOSIS — Z9889 Other specified postprocedural states: Secondary | ICD-10-CM | POA: Diagnosis not present

## 2018-07-04 DIAGNOSIS — O24313 Unspecified pre-existing diabetes mellitus in pregnancy, third trimester: Secondary | ICD-10-CM | POA: Diagnosis not present

## 2018-07-04 DIAGNOSIS — Z7982 Long term (current) use of aspirin: Secondary | ICD-10-CM | POA: Insufficient documentation

## 2018-07-04 DIAGNOSIS — Z888 Allergy status to other drugs, medicaments and biological substances status: Secondary | ICD-10-CM | POA: Diagnosis not present

## 2018-07-04 DIAGNOSIS — Z794 Long term (current) use of insulin: Secondary | ICD-10-CM | POA: Insufficient documentation

## 2018-07-04 DIAGNOSIS — Z8619 Personal history of other infectious and parasitic diseases: Secondary | ICD-10-CM | POA: Diagnosis not present

## 2018-07-04 DIAGNOSIS — N898 Other specified noninflammatory disorders of vagina: Secondary | ICD-10-CM | POA: Diagnosis present

## 2018-07-04 LAB — URINALYSIS, COMPLETE (UACMP) WITH MICROSCOPIC
BILIRUBIN URINE: NEGATIVE
GLUCOSE, UA: NEGATIVE mg/dL
KETONES UR: 20 mg/dL — AB
Nitrite: NEGATIVE
PH: 6 (ref 5.0–8.0)
PROTEIN: 30 mg/dL — AB
Specific Gravity, Urine: 1.02 (ref 1.005–1.030)

## 2018-07-04 LAB — WET PREP, GENITAL
Clue Cells Wet Prep HPF POC: NONE SEEN
SPERM: NONE SEEN
Trich, Wet Prep: NONE SEEN

## 2018-07-04 LAB — CHLAMYDIA/NGC RT PCR (ARMC ONLY)
CHLAMYDIA TR: NOT DETECTED
N GONORRHOEAE: NOT DETECTED

## 2018-07-04 MED ORDER — ACETAMINOPHEN 325 MG PO TABS: 650.0000 mg | ORAL_TABLET | ORAL | Status: DC | PRN

## 2018-07-04 MED ORDER — TERCONAZOLE 0.4 % VA CREA: 1.0000 | TOPICAL_CREAM | Freq: Every day | VAGINAL | 0 refills | Status: AC

## 2018-07-04 NOTE — Telephone Encounter (Signed)
Number on file not an working number.updated number from message. Called and left voicemail for patient to call back to be schedule

## 2018-07-04 NOTE — Telephone Encounter (Signed)
Pt is ~32wks, no prenatal care for this preg d/t 2746m old son who has trach and G-tube (we saw her for that preg and del); lost mucus plug and was unable to stay dry yesterday.  Things are better today but pt states she is scared.  Tx'd to SP to schedule.

## 2018-07-04 NOTE — OB Triage Note (Addendum)
Pt reports she lost her mucus plug yesterday and had a contraction this morning that was deep and long. She has received no prenatal care during this pregnancy. She is taking vitamins OTC. She had a child 10 months ago and he has significant birth defects. [facility] She doesn't remember when she got pregnant and took a pregnancy test OTC to find out. According to U/S in ED she is 31 weeks.

## 2018-07-04 NOTE — Final Progress Note (Signed)
Physician Final Progress Note  Patient ID: Carrie Garza MRN: [id number] DOB/AGE: Mar 15, 1996 22 y.o.  Admit date: 07/04/2018 Admitting provider: Vena Austria, MD Discharge date: 07/04/2018   Admission Diagnoses:  Indication for care in labor and delivery, antepartum  Discharge Diagnoses:  Vulvovaginal candidiasis  History of Present Illness: The patient is a 22 y.o. female G3P1011 at Unknown (last menstrual period approximately beginning of February) who arrived by EMS with complaints of  increased vaginal discharge and feeling a painful contraction this morning. She reports that she has had "mucous" discharge for about a day. She reports vaginal irritation and itching for the past two weeks. She was having some brownish discharge that began with these symptoms, but that has resolved. She is concerned that she may have been exposed to an STI through her partner. She denies vaginal bleeding or loss of fluid. She reports good fetal movement. She has not been seen during this pregnancy for any prenatal care. She has Type II diabetes and reports that she feels a little lightheaded and has not had anything to eat today. EMS reported that her blood glucose is 108.  Review of Systems: Review of systems negative unless otherwise noted in HPI.   Past Medical History:  Diagnosis Date  . ADHD (attention deficit hyperactivity disorder)   . Asthma   . Diabetes mellitus without complication (HCC)   . Elevated blood pressure   . History of chicken pox   . Knee fracture, left 02/2003  . Obesity   . Polycystic ovarian syndrome   . Pregnancy induced hypertension     Past Surgical History:  Procedure Laterality Date  . LABIOPLASTY  2008   hypertrophy B labia. Toya Smothers  . TONSILLECTOMY AND ADENOIDECTOMY  2001    No current facility-administered medications on file prior to encounter.    Current Outpatient Medications on File Prior to Encounter  Medication Sig Dispense Refill  . aspirin  EC 81 MG tablet Take 81 mg by mouth daily.    . folic acid (FOLVITE) 1 MG tablet Take 1 tablet (1 mg total) by mouth daily. 90 tablet 1  . hydrocortisone 1 % ointment Apply 1 application topically 2 (two) times daily. (Patient not taking: Reported on 08/04/2017) 30 g 0  . insulin regular (NOVOLIN R,HUMULIN R) 100 units/mL injection Inject into the skin 2 (two) times daily before a meal.    . metFORMIN (GLUCOPHAGE-XR) 500 MG 24 hr tablet TAKE 2 TABLETS BY MOUTH TWICE A DAY (Patient not taking: Reported on 08/04/2017) 120 tablet 2  . Prenatal Vit-Fe Fumarate-FA (PRENATAL VITAMIN PO) Take by mouth.      Allergies  Allergen Reactions  . Cefzil  [Cefprozil]     Social History   Socioeconomic History  . Marital status: Single    Spouse name: Not on file  . Number of children: 0  . Years of education: Not on file  . Highest education level: Not on file  Occupational History  . Not on file  Social Needs  . Financial resource strain: Not on file  . Food insecurity:    Worry: Not on file    Inability: Not on file  . Transportation needs:    Medical: Not on file    Non-medical: Not on file  Tobacco Use  . Smoking status: Former Smoker    Last attempt to quit: 08/12/2013    Years since quitting: 4.8  . Smokeless tobacco: Never Used  Substance and Sexual Activity  . Alcohol use: No  Alcohol/week: 0.0 standard drinks  . Drug use: No  . Sexual activity: Yes    Partners: Male    Comment: 2+ lifetime partners. sporadic condom use. Chlamydia 2010.   Lifestyle  . Physical activity:    Days per week: Not on file    Minutes per session: Not on file  . Stress: Not on file  Relationships  . Social connections:    Talks on phone: Not on file    Gets together: Not on file    Attends religious service: Not on file    Active member of club or organization: Not on file    Attends meetings of clubs or organizations: Not on file    Relationship status: Not on file  . Intimate partner  violence:    Fear of current or ex partner: Not on file    Emotionally abused: Not on file    Physically abused: Not on file    Forced sexual activity: Not on file  Other Topics Concern  . Not on file  Social History Narrative  . Not on file    Family history: Negative/unremarkable except as detailed in HPI.   Physical Exam: BP (!) 108/57 (BP Location: Left Arm)   Pulse 91   Temp 98.3 F (36.8 C) (Oral)   Resp 18   Ht 5\' 5"  (1.651 m)   Wt (!) 145.2 kg   BMI 53.25 kg/m   Gen: NAD CV: Regular rate Pulm: No increased work of breathing Abdomen: gravid, nontender Pelvic: SSE: visually closed, moderate white discharge present, no pooling, ferning negative on microscopy. SVE: 0/thick/high Ext: No signs of DVT  NST: Baseline: 145 Variability: moderate Accelerations: present Decelerations: present (variable x 1) Tocometry: no contractions The patient was monitored for 30+ minutes, fetal heart rate tracing was deemed reactive.  Consults: None  Significant Findings/ Diagnostic Studies: labs: wet prep positive for yeast. GC and urine culture pending.  Procedures: SSE, NST  Discharge Condition: good  Disposition: Discharge disposition: 01-Home or Self Care       Diet: Diabetic diet  Discharge Activity: Activity as tolerated  Discharge Instructions    AMB referral to maternal fetal medicine   Complete by:  As directed    Date Requested:  07/11/2018   US Study Requested:  OB Detail + 14   Other Study Requested:  Diabetic Counseling   Number of Fetus:  One   Referral Type:  [facility] Physician Consult/Plan of Care Recommendations   Reason for Referral:  Management of care. Patient previously managed by MFM, no prenatal care this pregnancy.   Discharge activity:  No Restrictions   Complete by:  As directed    Discharge diet:  No restrictions   Complete by:  As directed    No sexual activity restrictions   Complete by:  As directed    Notify physician for a general  feeling that "something is not right"   Complete by:  As directed    Notify physician for increase or change in vaginal discharge   Complete by:  As directed    Notify physician for intestinal cramps, with or without diarrhea, sometimes described as "gas pain"   Complete by:  As directed    Notify physician for leaking of fluid   Complete by:  As directed    Notify physician for low, dull backache, unrelieved by heat or Tylenol   Complete by:  As directed    Notify physician for menstrual like cramps   Complete by:  As directed    Notify physician for pelvic pressure   Complete by:  As directed    Notify physician for uterine contractions.  These may be painless and feel like the uterus is tightening or the baby is  "balling up"   Complete by:  As directed    Notify physician for vaginal bleeding   Complete by:  As directed    PRETERM LABOR:  Includes any of the follwing symptoms that occur between 20 - [redacted] weeks gestation.  If these symptoms are not stopped, preterm labor can result in preterm delivery, placing your baby at risk   Complete by:  As directed      Allergies as of 07/04/2018      Reactions   Cefzil  [cefprozil]       Medication List    TAKE these medications   aspirin EC 81 MG tablet Take 81 mg by mouth daily.   folic acid 1 MG tablet Commonly known as:  FOLVITE Take 1 tablet (1 mg total) by mouth daily.   hydrocortisone 1 % ointment Apply 1 application topically 2 (two) times daily.   insulin regular 100 units/mL injection Commonly known as:  NOVOLIN R,HUMULIN R Inject into the skin 2 (two) times daily before a meal.   metFORMIN 500 MG 24 hr tablet Commonly known as:  GLUCOPHAGE-XR TAKE 2 TABLETS BY MOUTH TWICE A DAY   PRENATAL VITAMIN PO Take by mouth.   terconazole 0.4 % vaginal cream Commonly known as:  TERAZOL 7 Place 1 applicator vaginally at bedtime for 7 days.      No signs of preterm labor; no contractions in triage. She felt better after  eating a small meal. Rx sent for terconazole to treat yeast infection. Will call with results of GC and urine culture and send any additional medications as necessary. Emphasized with patient the importance of establishing prenatal care. Referral [facility] to [facility].  Signed: Oswaldo Conroy, CNM  07/04/2018, 6:01 PM

## 2018-07-04 NOTE — Discharge Summary (Signed)
See Final Progress Note 07/04/2018. 

## 2018-07-04 NOTE — ED Notes (Signed)
Pt here with reports that her LMP was mid feb and that she has not had ANY prenatal care. L&D called to get room assignment and asked for us to confirm pregnancy prior to transport. Dr Mayford KnifeWilliams called to bedside, US confirms baby present. Unable to obtain FHT.

## 2018-07-06 LAB — URINE CULTURE

## 2018-07-07 ENCOUNTER — Other Ambulatory Visit: Payer: Self-pay | Admitting: Maternal Newborn

## 2018-07-07 ENCOUNTER — Ambulatory Visit (HOSPITAL_COMMUNITY): Admission: RE | Admit: 2018-07-07 | Payer: Medicaid Other | Source: Ambulatory Visit

## 2018-07-07 DIAGNOSIS — O0990 Supervision of high risk pregnancy, unspecified, unspecified trimester: Secondary | ICD-10-CM

## 2018-07-07 DIAGNOSIS — Z369 Encounter for antenatal screening, unspecified: Secondary | ICD-10-CM

## 2018-07-18 ENCOUNTER — Other Ambulatory Visit: Payer: Self-pay

## 2018-07-18 ENCOUNTER — Observation Stay
Admission: EM | Admit: 2018-07-18 | Discharge: 2018-07-18 | Disposition: A | Discharge status: Home / Self Care | Payer: Medicaid Other | Attending: Obstetrics and Gynecology | Admitting: Obstetrics and Gynecology

## 2018-07-18 DIAGNOSIS — Z87891 Personal history of nicotine dependence: Secondary | ICD-10-CM | POA: Diagnosis not present

## 2018-07-18 DIAGNOSIS — O24913 Unspecified diabetes mellitus in pregnancy, third trimester: Secondary | ICD-10-CM | POA: Insufficient documentation

## 2018-07-18 DIAGNOSIS — Z794 Long term (current) use of insulin: Secondary | ICD-10-CM | POA: Insufficient documentation

## 2018-07-18 DIAGNOSIS — Z7982 Long term (current) use of aspirin: Secondary | ICD-10-CM | POA: Diagnosis not present

## 2018-07-18 DIAGNOSIS — O98813 Other maternal infectious and parasitic diseases complicating pregnancy, third trimester: Principal | ICD-10-CM | POA: Insufficient documentation

## 2018-07-18 DIAGNOSIS — Z79899 Other long term (current) drug therapy: Secondary | ICD-10-CM | POA: Diagnosis not present

## 2018-07-18 DIAGNOSIS — B373 Candidiasis of vulva and vagina: Secondary | ICD-10-CM | POA: Diagnosis not present

## 2018-07-18 DIAGNOSIS — Z3A33 33 weeks gestation of pregnancy: Secondary | ICD-10-CM | POA: Diagnosis not present

## 2018-07-18 LAB — URINALYSIS, COMPLETE (UACMP) WITH MICROSCOPIC
Bilirubin Urine: NEGATIVE
GLUCOSE, UA: 150 mg/dL — AB
KETONES UR: 5 mg/dL — AB
Nitrite: NEGATIVE
PROTEIN: 30 mg/dL — AB
Specific Gravity, Urine: 1.027 (ref 1.005–1.030)
pH: 6 (ref 5.0–8.0)

## 2018-07-18 LAB — CBC
HEMATOCRIT: 37.6 % (ref 35.0–47.0)
HEMOGLOBIN: 12.4 g/dL (ref 12.0–16.0)
MCH: 27.7 pg (ref 26.0–34.0)
MCHC: 33 g/dL (ref 32.0–36.0)
MCV: 84 fL (ref 80.0–100.0)
Platelets: 240 10*3/uL (ref 150–440)
RBC: 4.48 MIL/uL (ref 3.80–5.20)
RDW: 14.8 % — ABNORMAL HIGH (ref 11.5–14.5)
WBC: 8.5 10*3/uL (ref 3.6–11.0)

## 2018-07-18 LAB — WET PREP, GENITAL
CLUE CELLS WET PREP: NONE SEEN
SPERM: NONE SEEN
Trich, Wet Prep: NONE SEEN

## 2018-07-18 LAB — GLUCOSE, RANDOM: GLUCOSE: 101 mg/dL — AB (ref 70–99)

## 2018-07-18 MED ORDER — FLUCONAZOLE 50 MG PO TABS
150.0000 mg | ORAL_TABLET | Freq: Once | ORAL | Status: AC
  Administered 2018-07-18: 150 mg via ORAL
  Filled 2018-07-18: qty 1

## 2018-07-18 NOTE — Discharge Summary (Signed)
See Final Progress Note 07/18/2018. 

## 2018-07-18 NOTE — OB Triage Note (Signed)
Pt came into ED with a complaint of light vaginal bleeding, ctx this am that were 10-15 apart. She is still feeling the baby move, she states that she had a wet spot in her pants earlier. She also mentions a headache and light headedness and dizziness.

## 2018-07-18 NOTE — Final Progress Note (Signed)
Physician Final Progress Note  Patient ID: Carrie Garza MRN: [id number] DOB/AGE: 1996/03/09 22 y.o.  Admit date: 07/18/2018 Admitting provider: Natale Milch, MD Discharge date: 07/18/2018   Admission Diagnoses: Indication for care, labor and delivery  Discharge Diagnoses:  Vulvovaginal candidiasis  History of Present Illness: The patient is a 22 y.o. female G3P1011 at [redacted]w[redacted]d who presents for some light pink blood this morning on the toilet paper when she wiped. She was also having some contractions around 15 minutes apart. Endorses good fetal movement. Has vaginal irritation and some discharge. No loss of fluid. Sometimes feels light-headed/dizzy and has a mild headache. Did not go to her appointment at [facility] today because of transportation issues, but has rescheduled for this Friday.  Review of Systems: Review of systems negative unless otherwise noted in HPI.   Past Medical History:  Diagnosis Date  . ADHD (attention deficit hyperactivity disorder)   . Asthma   . Diabetes mellitus without complication (HCC)   . Elevated blood pressure   . History of chicken pox   . Knee fracture, left 02/2003  . Obesity   . Polycystic ovarian syndrome   . Pregnancy induced hypertension     Past Surgical History:  Procedure Laterality Date  . LABIOPLASTY  2008   hypertrophy B labia. Toya Smothers  . TONSILLECTOMY AND ADENOIDECTOMY  2001    No current facility-administered medications on file prior to encounter.    Current Outpatient Medications on File Prior to Encounter  Medication Sig Dispense Refill  . aspirin EC 81 MG tablet Take 81 mg by mouth daily.    . folic acid (FOLVITE) 1 MG tablet Take 1 tablet (1 mg total) by mouth daily. 90 tablet 1  . hydrocortisone 1 % ointment Apply 1 application topically 2 (two) times daily. (Patient not taking: Reported on 08/04/2017) 30 g 0  . insulin regular (NOVOLIN R,HUMULIN R) 100 units/mL injection Inject into the skin 2 (two)  times daily before a meal.    . metFORMIN (GLUCOPHAGE-XR) 500 MG 24 hr tablet TAKE 2 TABLETS BY MOUTH TWICE A DAY (Patient not taking: Reported on 08/04/2017) 120 tablet 2  . Prenatal Vit-Fe Fumarate-FA (PRENATAL VITAMIN PO) Take by mouth.      Allergies  Allergen Reactions  . Cefzil  [Cefprozil]     Social History   Socioeconomic History  . Marital status: Single    Spouse name: Not on file  . Number of children: 0  . Years of education: Not on file  . Highest education level: Not on file  Occupational History  . Not on file  Social Needs  . Financial resource strain: Not on file  . Food insecurity:    Worry: Not on file    Inability: Not on file  . Transportation needs:    Medical: Not on file    Non-medical: Not on file  Tobacco Use  . Smoking status: Former Smoker    Last attempt to quit: 08/12/2013    Years since quitting: 4.9  . Smokeless tobacco: Never Used  Substance and Sexual Activity  . Alcohol use: No    Alcohol/week: 0.0 standard drinks  . Drug use: No  . Sexual activity: Yes    Partners: Male    Comment: 2+ lifetime partners. sporadic condom use. Chlamydia 2010.   Lifestyle  . Physical activity:    Days per week: Not on file    Minutes per session: Not on file  . Stress: Not on file  Relationships  . Social connections:    Talks on phone: Not on file    Gets together: Not on file    Attends religious service: Not on file    Active member of club or organization: Not on file    Attends meetings of clubs or organizations: Not on file    Relationship status: Not on file  . Intimate partner violence:    Fear of current or ex partner: Not on file    Emotionally abused: Not on file    Physically abused: Not on file    Forced sexual activity: Not on file  Other Topics Concern  . Not on file  Social History Narrative  . Not on file    Family history: Negative/unremarkable except as detailed in HPI. No family history of birth defects.   Physical  Exam: BP 113/62 (BP Location: Right Arm)   Pulse 83   Temp 98.3 F (36.8 C) (Oral)   Resp 18   LMP 11/24/2017   Gen: NAD CV: Regular rate Pulm: No increased work of breathing Pelvic: 0/0/high Ext: no signs of DVT  NST: Baseline: 145 Variability: moderate Accelerations: present Decelerations: absent Tocometry: occasional uterine irritability The patient was monitored for 30+ minutes, fetal heart rate tracing was deemed reactive.  Consults: None  Significant Findings/ Diagnostic Studies: labs: wet prep positive for yeast, CBC within normal limits, BG 101 (random, not a true fasting value).  Procedures: SVE, NST  Discharge Condition: good  Disposition: Discharge disposition: 01-Home or Self Care     Discharge home  Diet: Diabetic diet  Discharge Activity: Activity as tolerated  Discharge Instructions    Discharge activity:  No Restrictions   Complete by:  As directed    Discharge diet:  No restrictions   Complete by:  As directed    No sexual activity restrictions   Complete by:  As directed    Notify physician for a general feeling that "something is not right"   Complete by:  As directed    Notify physician for increase or change in vaginal discharge   Complete by:  As directed    Notify physician for intestinal cramps, with or without diarrhea, sometimes described as "gas pain"   Complete by:  As directed    Notify physician for leaking of fluid   Complete by:  As directed    Notify physician for low, dull backache, unrelieved by heat or Tylenol   Complete by:  As directed    Notify physician for menstrual like cramps   Complete by:  As directed    Notify physician for pelvic pressure   Complete by:  As directed    Notify physician for uterine contractions.  These may be painless and feel like the uterus is tightening or the baby is  "balling up"   Complete by:  As directed    Notify physician for vaginal bleeding   Complete by:  As directed    PRETERM  LABOR:  Includes any of the follwing symptoms that occur between 20 - [redacted] weeks gestation.  If these symptoms are not stopped, preterm labor can result in preterm delivery, placing your baby at risk   Complete by:  As directed      Allergies as of 07/18/2018      Reactions   Cefzil  [cefprozil]       Medication List    STOP taking these medications   metFORMIN 500 MG 24 hr tablet Commonly known as:  GLUCOPHAGE-XR  TAKE these medications   aspirin EC 81 MG tablet Take 81 mg by mouth daily.   folic acid 1 MG tablet Commonly known as:  FOLVITE Take 1 tablet (1 mg total) by mouth daily.   hydrocortisone 1 % ointment Apply 1 application topically 2 (two) times daily.   insulin regular 100 units/mL injection Commonly known as:  NOVOLIN R,HUMULIN R Inject into the skin 2 (two) times daily before a meal.   PRENATAL VITAMIN PO Take by mouth.       Treated with fluconazole here as she was not able to pick up her Rx and treat yeast vaginally at home after the last visit on 9/23. Emphasized need for prenatal care/monitoring and she says that she has transportation to her scheduled prenatal visit at [facility] on 07/21/2018.  Signed: Oswaldo Conroy, CNM  07/18/2018, 5:59 PM

## 2018-08-24 ENCOUNTER — Observation Stay
Admission: EM | Admit: 2018-08-24 | Discharge: 2018-08-25 | Disposition: A | Discharge status: Other Acute Inpatient Hospital | Payer: Medicaid Other | Attending: Certified Nurse Midwife | Admitting: Certified Nurse Midwife

## 2018-08-24 ENCOUNTER — Encounter: Payer: Self-pay | Admitting: *Deleted

## 2018-08-24 DIAGNOSIS — Z87891 Personal history of nicotine dependence: Secondary | ICD-10-CM | POA: Diagnosis not present

## 2018-08-24 DIAGNOSIS — Z8249 Family history of ischemic heart disease and other diseases of the circulatory system: Secondary | ICD-10-CM | POA: Insufficient documentation

## 2018-08-24 DIAGNOSIS — E282 Polycystic ovarian syndrome: Secondary | ICD-10-CM | POA: Diagnosis not present

## 2018-08-24 DIAGNOSIS — E119 Type 2 diabetes mellitus without complications: Secondary | ICD-10-CM | POA: Diagnosis not present

## 2018-08-24 DIAGNOSIS — Z823 Family history of stroke: Secondary | ICD-10-CM | POA: Diagnosis not present

## 2018-08-24 DIAGNOSIS — O99283 Endocrine, nutritional and metabolic diseases complicating pregnancy, third trimester: Secondary | ICD-10-CM | POA: Diagnosis not present

## 2018-08-24 DIAGNOSIS — Z7982 Long term (current) use of aspirin: Secondary | ICD-10-CM | POA: Diagnosis not present

## 2018-08-24 DIAGNOSIS — Z3A38 38 weeks gestation of pregnancy: Secondary | ICD-10-CM | POA: Diagnosis not present

## 2018-08-24 DIAGNOSIS — O99513 Diseases of the respiratory system complicating pregnancy, third trimester: Secondary | ICD-10-CM | POA: Insufficient documentation

## 2018-08-24 DIAGNOSIS — Z7984 Long term (current) use of oral hypoglycemic drugs: Secondary | ICD-10-CM | POA: Diagnosis not present

## 2018-08-24 DIAGNOSIS — J45909 Unspecified asthma, uncomplicated: Secondary | ICD-10-CM | POA: Diagnosis not present

## 2018-08-24 DIAGNOSIS — O99213 Obesity complicating pregnancy, third trimester: Secondary | ICD-10-CM | POA: Insufficient documentation

## 2018-08-24 DIAGNOSIS — Z881 Allergy status to other antibiotic agents status: Secondary | ICD-10-CM | POA: Diagnosis not present

## 2018-08-24 LAB — GLUCOSE, CAPILLARY: GLUCOSE-CAPILLARY: 87 mg/dL (ref 70–99)

## 2018-08-24 NOTE — OB Triage Note (Signed)
Recvd pt from ED via EMS. Pt arrived with [age] year old. One year old has a trach and is completely taking care of baby. Pt c/o contractions that started last night but that have gotten worse around 1630 this afternoon. No leaking of fluid or vaginal bleeding. Feeling baby move well.

## 2018-08-25 ENCOUNTER — Ambulatory Visit (HOSPITAL_COMMUNITY)
Admission: AD | Admit: 2018-08-25 | Discharge: 2018-08-25 | Disposition: A | Discharge status: Home / Self Care | Payer: Medicaid Other | Source: Other Acute Inpatient Hospital | Attending: Obstetrics and Gynecology | Admitting: Obstetrics and Gynecology

## 2018-08-25 DIAGNOSIS — Z3A00 Weeks of gestation of pregnancy not specified: Secondary | ICD-10-CM | POA: Diagnosis not present

## 2018-08-25 MED ORDER — BUTORPHANOL TARTRATE 2 MG/ML IJ SOLN
INTRAMUSCULAR | Status: AC
  Filled 2018-08-25: qty 1

## 2018-08-25 NOTE — Final Progress Note (Signed)
Physician Final Progress Note  Patient ID: Carrie Garza MRN: [id number] DOB/AGE: 24/11/1995 24 y.o.  Admit date: 08/24/2018 Admitting provider: Natale Milchhristanna R Schuman, MD/ Carrie Garza, CNM Discharge date: 08/25/2018   Admission Diagnoses: IUP at [phone] with contractions Prior Cesarean section Type 2 diabetes Morbid obesity Insufficient prenatal care  Discharge Diagnoses:  Same as above IUP at 38 weeks 4 days in early labor Desires TOLAC Consults: [facility]  Significant Findings/ Diagnostic Studies: Carrie Garza is a 22 y.o. 643P1011 Black female with EDC=09/04/2018 at 38wk3d dated by a 35.1 week ulttrasound.  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  She has been taking Metformin 500 mgm BID and reports that her fastings are usually 85-90 and 2 hour postprandials 100-135. Hemoglobin A1C in October was 5.8% She presented with contractions that were increasing in intensity and frequency. Her cervix remained 3/90%/-1 after one hour. Fetal tracing was Cat 1. She was interested in a TOLAC. Discussed increased risks of uterine rupture in light of her close spacing of pregnancies ( last Cesarean was 1 year ago) and risks associated with doing an emergency Cesarean, including difficult intubation, adhesions, bleeding, delay delivering baby.  Mutual decision made to transfer to [facility] for her TOLAC. Consulted Dr Carrie Garza who agrees to accept patient Cervical check before leaving via ambulance was 3.5/90%/-1 to -2/ vertex  Procedures: none  Discharge Condition: good  Disposition: Transfer to [facility]  Diet: Regular diet  Discharge Activity: Activity as tolerated   Allergies as of 08/25/2018      Reactions   Cefzil [cefprozil] Hives      Medication List    STOP taking these medications   aspirin EC 81 MG  tablet     TAKE these medications   metFORMIN 500 MG tablet Commonly known as:  GLUCOPHAGE Take 500 mg by mouth 2 (two) times daily with a meal.   PRENATAL VITAMIN PO Take by mouth.        Total time spent taking care of this patient: 40 minutes  Signed: Farrel Garza Areanna Gengler 08/25/2018, 6:59 AM

## 2018-08-25 NOTE — H&P (Signed)
OB History & Physical   History of Present Illness:  Chief Complaint:   HPI:  Carrie Garza is a 22 y.o. G8P1011 Black female with EDC=09/04/2018 at 38wk3d dated by a 35.1 week ulttrasound.  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  She has been taking Metformin 500 mgm BID and reports that her fastings are usually 85-90 and 2 hour postprandials 100-135. Hemoglobin A1C in October was 5.8%  She presents to L&D for evaluation of labor. Complains of contractions every 10-15 minutes x 24 hours, but the contractions are getting stronger and closer.     Prenatal care site: Had initial obstetrical visit at [facility] 08/03/2018 and discussed TOLAC      Maternal Medical History:   Past Medical History:  Diagnosis Date  . ADHD (attention deficit hyperactivity disorder)   . Asthma   . Diabetes mellitus without complication (HCC)   . Elevated blood pressure   . History of chicken pox   . Knee fracture, left 02/2003  . Obesity   . Polycystic ovarian syndrome   . Pregnancy induced hypertension     Past Surgical History:  Procedure Laterality Date  . LABIOPLASTY  2008   hypertrophy B labia. Toya Smothers  . TONSILLECTOMY AND ADENOIDECTOMY  2001    Allergies  Allergen Reactions  . Cefzil  [Cefprozil]     Medication: No current facility-administered medications on file prior to encounter.    Current Outpatient Medications on File Prior to Encounter  Medication Sig Dispense Refill  . aspirin EC 81 MG tablet Take 81 mg by mouth daily.    . metFORMIN (GLUCOPHAGE) 500 MG tablet Take 500 mg by mouth 2 (two) times daily with a meal.    . Prenatal Vit-Fe Fumarate-FA (PRENATAL VITAMIN PO) Take by mouth.        Social History: She  reports that she quit smoking about 5 years ago. She has never used smokeless tobacco. She  reports that she does not drink alcohol or use drugs.  Family History: family history includes Asthma in her mother; Diabetes in her mother and other; Hypertension in her maternal grandfather, maternal grandmother, and mother; Obesity in her mother; Prostate cancer in her maternal grandfather; Stroke in her other.   Review of Systems: Negative x 10 systems reviewed except as noted in the HPI.      Physical Exam:  Vital Signs: BP 127/81 (BP Location: Left Arm)   Pulse 80   Temp 98.6 F (37 C) (Oral)   Resp 16   Ht 5\' 5"  (1.651 m)   Wt (!) 141.5 kg   LMP 11/24/2017   BMI 51.92 kg/m  General: no acute distress, grimacing with some contractions.  HEENT: normocephalic, atraumatic Heart: regular rate & rhythm.  No murmurs/rubs/gallops Lungs: clear to auscultation bilaterally Abdomen: soft, gravid, non-tender;  EFW: 7 1/2# Pelvic:   External: Normal external female genitalia  Cervix: Dilation: 3 / Effacement (%): 90 / Station: -1   Extremities: non-tender, symmetric, no edema bilaterally.  DTRs: +1  Neurologic: Alert & oriented x 3.    Pertinent Results:  Prenatal Labs: Blood type/Rh B positive  Antibody screen negative  Rubella Varicella Immune immune  RPR Negative TP antibody  HBsAg Non reactive  HIV Non reactive  GC negative  Chlamydia negative  Genetic screening Not done  1 hour GTT Not done-late entry. Hemoglobin A1C 5.8%  3 hour GTT   GBS negative on 08/03/2018   Baseline FHR: 135 with accelerations to 150, moderate variability Toco: contractions every 2-5 minutes apart  Bedside Ultrasound: vertex/ ROA   Assessment:  Carrie Garza is a 22 y.o. 773P1011 female at 1838wk4d with contractions. Cervix has changed since her last cervical exam last month. R/O early labor vs false labor Previous Cesarean section 1 year ago-desires TOLAC Little prenatal care Morbid obesity with BMI=52 FWB: Cat 1 tracing  Plan:  1. Admit for observation. Monitor progress and fetal  well being. Recheck in 1 hour 2. NPO for now in case of Cesarean section. Discussed  risk of uterine rupture with labor after Cesarean section. This may result in fetal hypoxia and death. Emergency Cesarean section would require general anesthesia and there may be problems with her intubation due to obesity. There is also risk of adhesions which may also interfere with  rapid delivery after general anesthesia and increase blood loss. [facility] 2. GBS negative.   3. Consents obtained. 4. B POS/ RI/ VI   Farrel ConnersColleen Monta Police  08/24/2018 11:29 PM

## 2018-11-27 ENCOUNTER — Encounter (HOSPITAL_COMMUNITY): Payer: Self-pay

## 2019-01-14 ENCOUNTER — Emergency Department
Admission: EM | Admit: 2019-01-14 | Discharge: 2019-01-14 | Disposition: A | Discharge status: Home / Self Care | Payer: Medicaid Other | Attending: Emergency Medicine | Admitting: Emergency Medicine

## 2019-01-14 ENCOUNTER — Other Ambulatory Visit: Payer: Self-pay

## 2019-01-14 DIAGNOSIS — L0291 Cutaneous abscess, unspecified: Secondary | ICD-10-CM | POA: Diagnosis present

## 2019-01-14 DIAGNOSIS — L0591 Pilonidal cyst without abscess: Secondary | ICD-10-CM | POA: Diagnosis not present

## 2019-01-14 DIAGNOSIS — Z7984 Long term (current) use of oral hypoglycemic drugs: Secondary | ICD-10-CM | POA: Diagnosis not present

## 2019-01-14 DIAGNOSIS — Z87891 Personal history of nicotine dependence: Secondary | ICD-10-CM | POA: Diagnosis not present

## 2019-01-14 DIAGNOSIS — E119 Type 2 diabetes mellitus without complications: Secondary | ICD-10-CM | POA: Diagnosis not present

## 2019-01-14 MED ORDER — SULFAMETHOXAZOLE-TRIMETHOPRIM 800-160 MG PO TABS: 1.0000 | ORAL_TABLET | Freq: Two times a day (BID) | ORAL | 0 refills | Status: DC

## 2019-01-14 NOTE — ED Provider Notes (Signed)
[facility] Emergency Department Provider Note  ____________________________________________  Time seen: Approximately 10:51 AM  I have reviewed the triage vital signs and the nursing notes.   HISTORY  Chief Complaint Abscess   HPI Carrie Garza is a 23 y.o. female who presents to the emergency department for treatment and evaluation of a tender area on her buttocks. She noticed "boils" about 2 weeks ago, but last night she started having more pain. No fever. She has a history of boils.   Past Medical History:  Diagnosis Date  . ADHD (attention deficit hyperactivity disorder)   . Asthma   . Diabetes mellitus without complication (HCC)   . Elevated blood pressure   . History of chicken pox   . Knee fracture, left 02/2003  . Obesity   . Polycystic ovarian syndrome   . Pregnancy induced hypertension     Patient Active Problem List   Diagnosis Date Noted  . Indication for care in labor and delivery, antepartum 07/04/2018  . Labor and delivery indication for care or intervention 08/04/2017  . Abnormal genetic test during pregnancy 03/16/2017  . Pre-existing type 2 diabetes mellitus during pregnancy, antepartum 02/11/2017  . Supervision of high risk pregnancy, antepartum 01/06/2017  . Obesity affecting pregnancy, antepartum 01/06/2017  . BMI 50.0-59.9, adult (HCC) 01/06/2017  . Positive pregnancy test 03/05/2016  . Acanthosis nigricans 09/11/2015  . ADHD (attention deficit hyperactivity disorder) 09/11/2015  . Asthma 09/11/2015  . Blood pressure elevated without history of HTN 09/11/2015  . Eczema intertrigo 09/11/2015  . Polycystic ovarian syndrome 09/11/2015  . Diabetes mellitus, type 2 (HCC) 09/11/2015  . History of chlamydia 07/29/2009    Past Surgical History:  Procedure Laterality Date  . LABIOPLASTY  2008   hypertrophy B labia. Toya Smothers  . TONSILLECTOMY AND ADENOIDECTOMY  2001    Prior to Admission medications   Medication Sig  Start Date End Date Taking? Authorizing Provider  metFORMIN (GLUCOPHAGE) 500 MG tablet Take 500 mg by mouth 2 (two) times daily with a meal.    [provider]  Prenatal Vit-Fe Fumarate-FA (PRENATAL VITAMIN PO) Take by mouth.    [provider]  sulfamethoxazole-trimethoprim (BACTRIM DS,SEPTRA DS) 800-160 MG tablet Take 1 tablet by mouth 2 (two) times daily. 01/14/19   Chinita Pester, FNP    Allergies Cefzil [cefprozil]  Family History  Problem Relation Age of Onset  . Diabetes Mother   . Hypertension Mother   . Asthma Mother   . Obesity Mother   . Hypertension Maternal Grandmother   . Prostate cancer Maternal Grandfather   . Hypertension Maternal Grandfather   . Diabetes Other        type 2  . Stroke Other     Social History Social History   Tobacco Use  . Smoking status: Former Smoker    Last attempt to quit: 08/12/2013    Years since quitting: 5.4  . Smokeless tobacco: Never Used  Substance Use Topics  . Alcohol use: No    Alcohol/week: 0.0 standard drinks  . Drug use: No    Review of Systems  Constitutional: Negative for fever. Respiratory: Negative for cough or shortness of breath.  Musculoskeletal: Negative for myalgias Skin: Positive for tender areas at natal cleft. Neurological: Negative for numbness or paresthesias. ____________________________________________   PHYSICAL EXAM:  VITAL SIGNS: ED Triage Vitals  Enc Vitals Group     BP 01/14/19 1043 (!) 142/83     Pulse Rate 01/14/19 1043 96  Resp 01/14/19 1043 18     Temp 01/14/19 1043 99.2 F (37.3 C)     Temp Source 01/14/19 1043 Oral     SpO2 01/14/19 1043 100 %     Weight 01/14/19 1042 (!) 312 lb (141.5 kg)     Height 01/14/19 1042 5\' 6"  (1.676 m)     Head Circumference --      Peak Flow --      Pain Score 01/14/19 1041 8     Pain Loc --      Pain Edu? --      Excl. in GC? --      Constitutional: Well appearing. Eyes: Conjunctivae are clear without discharge or  drainage. Nose: No rhinorrhea noted. Mouth/Throat: Airway is patent.  Neck: No stridor. Unrestricted range of motion observed. Cardiovascular: Capillary refill is <3 seconds.  Respiratory: Respirations are even and unlabored.. Musculoskeletal: Unrestricted range of motion observed. Neurologic: Awake, alert, and oriented x 4.  Skin: Tenderness without fluctuance or erythema over the natal cleft.  ____________________________________________   LABS (all labs ordered are listed, but only abnormal results are displayed)  Labs Reviewed - No data to display ____________________________________________  EKG  Not indicated. ____________________________________________  RADIOLOGY  Not indicated. ____________________________________________   PROCEDURES  Procedures ____________________________________________   INITIAL IMPRESSION / ASSESSMENT AND PLAN / ED COURSE  Carrie Garza is a 23 y.o. female who presents to the emergency department for treatment of tenderness to the buttocks. Pilonidal cyst without obvious infection. She will be treated with Bactrim and advised to apply warm compresses or warm soaks. She is to follow up with primary care or return to the ER for symptoms that change or worsen.   Medications - No data to display   Pertinent labs & imaging results that were available during my care of the patient were reviewed by me and considered in my medical decision making (see chart for details).  ____________________________________________   FINAL CLINICAL IMPRESSION(S) / ED DIAGNOSES  Final diagnoses:  Pilonidal cyst    ED Discharge Orders         Ordered    sulfamethoxazole-trimethoprim (BACTRIM DS,SEPTRA DS) 800-160 MG tablet  2 times daily     01/14/19 1048           Note:  This document was prepared using Dragon voice recognition software and may include unintentional dictation errors.   Chinita Pester, FNP 01/14/19 1305    Minna Antis, MD 01/14/19 1353

## 2019-01-14 NOTE — Discharge Instructions (Signed)
Apply a warm compress or soak in a warm tub. Follow up with your doctor if area gets larger or more tender. Take the antibiotic as prescribed. Return to the ER for symptoms that change or worsen if unable to schedule an appointment.

## 2019-01-14 NOTE — ED Triage Notes (Signed)
Pt comes EMS for two "boils near my butt crack". Pt states they have been there for 2 weeks but last night she was unable to sit on her bottom. Denies fevers and chills.

## 2019-07-31 ENCOUNTER — Other Ambulatory Visit: Payer: Self-pay

## 2019-07-31 ENCOUNTER — Encounter: Payer: Self-pay | Admitting: Emergency Medicine

## 2019-07-31 ENCOUNTER — Emergency Department
Admission: EM | Admit: 2019-07-31 | Discharge: 2019-07-31 | Disposition: A | Discharge status: Home / Self Care | Payer: Medicaid Other | Attending: Emergency Medicine | Admitting: Emergency Medicine

## 2019-07-31 DIAGNOSIS — W500XXA Accidental hit or strike by another person, initial encounter: Secondary | ICD-10-CM | POA: Diagnosis not present

## 2019-07-31 DIAGNOSIS — Z79899 Other long term (current) drug therapy: Secondary | ICD-10-CM | POA: Insufficient documentation

## 2019-07-31 DIAGNOSIS — J45909 Unspecified asthma, uncomplicated: Secondary | ICD-10-CM | POA: Insufficient documentation

## 2019-07-31 DIAGNOSIS — Y92018 Other place in single-family (private) house as the place of occurrence of the external cause: Secondary | ICD-10-CM | POA: Diagnosis not present

## 2019-07-31 DIAGNOSIS — Z7984 Long term (current) use of oral hypoglycemic drugs: Secondary | ICD-10-CM | POA: Insufficient documentation

## 2019-07-31 DIAGNOSIS — Y9389 Activity, other specified: Secondary | ICD-10-CM | POA: Diagnosis not present

## 2019-07-31 DIAGNOSIS — S0501XA Injury of conjunctiva and corneal abrasion without foreign body, right eye, initial encounter: Secondary | ICD-10-CM | POA: Diagnosis not present

## 2019-07-31 DIAGNOSIS — Y998 Other external cause status: Secondary | ICD-10-CM | POA: Diagnosis not present

## 2019-07-31 DIAGNOSIS — E119 Type 2 diabetes mellitus without complications: Secondary | ICD-10-CM | POA: Insufficient documentation

## 2019-07-31 DIAGNOSIS — S0591XA Unspecified injury of right eye and orbit, initial encounter: Secondary | ICD-10-CM | POA: Diagnosis present

## 2019-07-31 DIAGNOSIS — Z87891 Personal history of nicotine dependence: Secondary | ICD-10-CM | POA: Diagnosis not present

## 2019-07-31 MED ORDER — TOBRAMYCIN 0.3 % OP SOLN
2.0000 [drp] | Freq: Once | OPHTHALMIC | Status: AC
  Administered 2019-07-31: 2 [drp] via OPHTHALMIC
  Filled 2019-07-31: qty 5

## 2019-07-31 MED ORDER — FLUORESCEIN SODIUM 1 MG OP STRP
1.0000 | ORAL_STRIP | Freq: Once | OPHTHALMIC | Status: AC
  Administered 2019-07-31: 14:00:00 1 via OPHTHALMIC
  Filled 2019-07-31: qty 1

## 2019-07-31 MED ORDER — TETRACAINE HCL 0.5 % OP SOLN
2.0000 [drp] | Freq: Once | OPHTHALMIC | Status: AC
  Administered 2019-07-31: 14:00:00 2 [drp] via OPHTHALMIC
  Filled 2019-07-31: qty 4

## 2019-07-31 MED ORDER — TOBRAMYCIN 0.3 % OP OINT: TOPICAL_OINTMENT | Freq: Two times a day (BID) | OPHTHALMIC | 0 refills | Status: AC
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
## 2019-07-31 NOTE — ED Notes (Signed)
Pt discharged home after verbalizing understanding of discharge instructions; nad noted. 

## 2019-10-19 ENCOUNTER — Emergency Department
Admission: EM | Admit: 2019-10-19 | Discharge: 2019-10-19 | Disposition: A | Discharge status: Home / Self Care | Payer: Medicaid Other | Attending: Emergency Medicine | Admitting: Emergency Medicine

## 2019-10-19 ENCOUNTER — Other Ambulatory Visit: Payer: Self-pay

## 2019-10-19 DIAGNOSIS — Y929 Unspecified place or not applicable: Secondary | ICD-10-CM | POA: Diagnosis not present

## 2019-10-19 DIAGNOSIS — W228XXA Striking against or struck by other objects, initial encounter: Secondary | ICD-10-CM | POA: Insufficient documentation

## 2019-10-19 DIAGNOSIS — S0592XA Unspecified injury of left eye and orbit, initial encounter: Secondary | ICD-10-CM | POA: Diagnosis present

## 2019-10-19 DIAGNOSIS — Y9389 Activity, other specified: Secondary | ICD-10-CM | POA: Diagnosis not present

## 2019-10-19 DIAGNOSIS — J45909 Unspecified asthma, uncomplicated: Secondary | ICD-10-CM | POA: Diagnosis not present

## 2019-10-19 DIAGNOSIS — S0502XA Injury of conjunctiva and corneal abrasion without foreign body, left eye, initial encounter: Secondary | ICD-10-CM

## 2019-10-19 DIAGNOSIS — Y999 Unspecified external cause status: Secondary | ICD-10-CM | POA: Diagnosis not present

## 2019-10-19 DIAGNOSIS — Z87891 Personal history of nicotine dependence: Secondary | ICD-10-CM | POA: Diagnosis not present

## 2019-10-19 DIAGNOSIS — Z7984 Long term (current) use of oral hypoglycemic drugs: Secondary | ICD-10-CM | POA: Diagnosis not present

## 2019-10-19 DIAGNOSIS — E119 Type 2 diabetes mellitus without complications: Secondary | ICD-10-CM | POA: Diagnosis not present

## 2019-10-19 MED ORDER — TOBRAMYCIN 0.3 % OP SOLN: 1.0000 [drp] | OPHTHALMIC | 0 refills | Status: AC

## 2019-10-19 MED ORDER — TETRACAINE HCL 0.5 % OP SOLN
2.0000 [drp] | Freq: Once | OPHTHALMIC | Status: AC
  Administered 2019-10-19: 2 [drp] via OPHTHALMIC
  Filled 2019-10-19: qty 4

## 2019-10-19 MED ORDER — TOBRAMYCIN 0.3 % OP SOLN
1.0000 [drp] | Freq: Once | OPHTHALMIC | Status: AC
  Administered 2019-10-19: 1 [drp] via OPHTHALMIC
  Filled 2019-10-19: qty 5

## 2019-10-19 MED ORDER — FLUORESCEIN SODIUM 1 MG OP STRP
1.0000 | ORAL_STRIP | Freq: Once | OPHTHALMIC | Status: AC
  Administered 2019-10-19: 1 via OPHTHALMIC
  Filled 2019-10-19: qty 1

## 2019-10-19 NOTE — ED Triage Notes (Signed)
Pt comes into ED via EMS from home with c/o injury to the left eye, states she hit it on the corner of the counter last night and it is watering today.

## 2019-10-19 NOTE — ED Provider Notes (Addendum)
[facility] Emergency Department Provider Note  ____________________________________________  Time seen: Approximately 11:24 AM  I have reviewed the triage vital signs and the nursing notes.   HISTORY  Chief Complaint No chief complaint on file.    HPI Carrie Garza is a 24 y.o. female that presents to the emergency department for evaluation of left eye injury.  Patient states that she hit her left eye on a coffee table last night.  When she woke up this morning, her eye was watering and swollen.  Was evaluated by myself in this emergency department in October for a different eye injury.  She states that she followed up with ophthalmology following the injury and it healed normally.  Past Medical History:  Diagnosis Date  . ADHD (attention deficit hyperactivity disorder)   . Asthma   . Diabetes mellitus without complication ([place])   . Elevated blood pressure   . History of chicken pox   . Knee fracture, left 02/2003  . Obesity   . Polycystic ovarian syndrome   . Pregnancy induced hypertension     Patient Active Problem List   Diagnosis Date Noted  . Indication for care in labor and delivery, antepartum 07/04/2018  . Labor and delivery indication for care or intervention 08/04/2017  . Abnormal genetic test during pregnancy 03/16/2017  . Pre-existing type 2 diabetes mellitus during pregnancy, antepartum 02/11/2017  . Supervision of high risk pregnancy, antepartum 01/06/2017  . Obesity affecting pregnancy, antepartum 01/06/2017  . BMI 50.0-59.9, adult ([place]) 01/06/2017  . Positive pregnancy test 03/05/2016  . Acanthosis nigricans 09/11/2015  . ADHD (attention deficit hyperactivity disorder) 09/11/2015  . Asthma 09/11/2015  . Blood pressure elevated without history of HTN 09/11/2015  . Eczema intertrigo 09/11/2015  . Polycystic ovarian syndrome 09/11/2015  . Diabetes mellitus, type 2 ([place]) 09/11/2015  . History of chlamydia 07/29/2009    Past  Surgical History:  Procedure Laterality Date  . LABIOPLASTY  2008   hypertrophy B labia. Georga Bora  . TONSILLECTOMY AND ADENOIDECTOMY  2001    Prior to Admission medications   Medication Sig Start Date End Date Taking? Authorizing Provider  metFORMIN (GLUCOPHAGE) 500 MG tablet Take 500 mg by mouth 2 (two) times daily with a meal.    [provider]  tobramycin (TOBREX) 0.3 % ophthalmic solution Place 1 drop into the right eye every 4 (four) hours for 10 days. 10/19/19 10/29/19  Laban Emperor, PA-C    Allergies Cefzil [cefprozil]  Family History  Problem Relation Age of Onset  . Diabetes Mother   . Hypertension Mother   . Asthma Mother   . Obesity Mother   . Hypertension Maternal Grandmother   . Prostate cancer Maternal Grandfather   . Hypertension Maternal Grandfather   . Diabetes Other        type 2  . Stroke Other     Social History Social History   Tobacco Use  . Smoking status: Former Smoker    Quit date: 08/12/2013    Years since quitting: 6.1  . Smokeless tobacco: Never Used  Substance Use Topics  . Alcohol use: No    Alcohol/week: 0.0 standard drinks  . Drug use: No     Review of Systems  Respiratory: No SOB. Gastrointestinal:  No nausea, no vomiting.  Musculoskeletal: Negative for musculoskeletal pain. Skin: Negative for rash, abrasions, lacerations, ecchymosis. Neurological: Negative for headaches   ____________________________________________   PHYSICAL EXAM:  VITAL SIGNS: ED Triage Vitals  Enc Vitals Group  BP 10/19/19 0937 139/87     Pulse Rate 10/19/19 0937 64     Resp 10/19/19 0937 17     Temp 10/19/19 0937 98.4 F (36.9 C)     Temp Source 10/19/19 0937 Oral     SpO2 10/19/19 0937 100 %     Weight 10/19/19 0938 (!) 315 lb (142.9 kg)     Height 10/19/19 0938 5\' 6"  (1.676 m)     Head Circumference --      Peak Flow --      Pain Score 10/19/19 0938 8     Pain Loc --      Pain Edu? --      Excl. in GC? --       Constitutional: Alert and oriented. Well appearing and in no acute distress. Eyes: Left conjunctiva is injected. PERRL. EOMI. 78mm corneal defect to 6pm position of iris on stain. Head: Atraumatic. ENT:      Ears:      Nose: No congestion/rhinnorhea.      Mouth/Throat: Mucous membranes are moist.  Neck: No stridor. Cardiovascular: Normal rate, regular rhythm.  Good peripheral circulation. Respiratory: Normal respiratory effort without tachypnea or retractions. Lungs CTAB. Good air entry to the bases with no decreased or absent breath sounds. Musculoskeletal: Full range of motion to all extremities. No gross deformities appreciated. Neurologic:  Normal speech and language. No gross focal neurologic deficits are appreciated.  Skin:  Skin is warm, dry and intact. No rash noted. Psychiatric: Mood and affect are normal. Speech and behavior are normal. Patient exhibits appropriate insight and judgement.   ____________________________________________   LABS (all labs ordered are listed, but only abnormal results are displayed)  Labs Reviewed - No data to display ____________________________________________  EKG   ____________________________________________  RADIOLOGY  No results found.  ____________________________________________    PROCEDURES  Procedure(s) performed:    Procedures    Medications  tetracaine (PONTOCAINE) 0.5 % ophthalmic solution 2 drop (2 drops Left Eye Given 10/19/19 1002)  fluorescein ophthalmic strip 1 strip (1 strip Left Eye Given 10/19/19 1002)  tobramycin (TOBREX) 0.3 % ophthalmic solution 1 drop (1 drop Left Eye Given 10/19/19 1117)     ____________________________________________   INITIAL IMPRESSION / ASSESSMENT AND PLAN / ED COURSE  Pertinent labs & imaging results that were available during my care of the patient were reviewed by me and considered in my medical decision making (see chart for details).  Review of the [place] CSRS was  performed in accordance of the NCM[facility] prior to dispensing any controlled drugs.   Patient's diagnosis is consistent with corneal abrasion.  Exam is consistent with corneal abrasion.  Patient will be discharged home with prescriptions for tobrex. Patient is to follow up with othamology as directed. Patient is given ED precautions to return to the ED for any worsening or new symptoms.  Carrie Garza was evaluated in Emergency Department on 10/19/2019 for the symptoms described in the history of present illness. She was evaluated in the context of the global COVID-19 pandemic, which necessitated consideration that the patient might be at risk for infection with the SARS-CoV-2 virus that causes COVID-19. Institutional protocols and algorithms that pertain to the evaluation of patients at risk for COVID-19 are in a state of rapid change based on information released by regulatory bodies including the CDC and federal and state organizations. These policies and algorithms were followed during the patient's care in the ED.   ____________________________________________  FINAL CLINICAL IMPRESSION(S) / ED DIAGNOSES  Final diagnoses:  Abrasion of left cornea, initial encounter      NEW MEDICATIONS STARTED DURING THIS VISIT:  ED Discharge Orders         Ordered    tobramycin (TOBREX) 0.3 % ophthalmic solution  Every 4 hours     10/19/19 1038              This chart was dictated using voice recognition software/Dragon. Despite best efforts to proofread, errors can occur which can change the meaning. Any change was purely unintentional.    Enid Derry, PA-C 10/19/19 1126    Enid Derry, PA-C 10/19/19 1132    Emily Filbert, MD 10/19/19 1311

## 2019-10-19 NOTE — ED Notes (Addendum)
See triage note  Presents with pain and swelling to left eye  States she bent down and hit her face on corner of coffee table last pm

## 2020-04-16 ENCOUNTER — Ambulatory Visit: Payer: Self-pay | Admitting: Physician Assistant

## 2020-04-17 ENCOUNTER — Ambulatory Visit: Payer: Self-pay | Admitting: Physician Assistant

## 2020-04-17 NOTE — Progress Notes (Deleted)
     Established patient visit   Patient: Carrie Garza   DOB: August 10, 1996   24 y.o. Female  MRN: [id number] Visit Date: 04/17/2020  Today's healthcare provider: Trey Sailors, PA-C   No chief complaint on file.  Subjective    HPI  ***  {Show patient history (optional):23778::" "}   Medications: Outpatient Medications Prior to Visit  Medication Sig  . metFORMIN (GLUCOPHAGE) 500 MG tablet Take 500 mg by mouth 2 (two) times daily with a meal.   No facility-administered medications prior to visit.    Review of Systems  {Heme  Chem  Endocrine  Serology  Results Review (optional):23779::" "}  Objective    There were no vitals taken for this visit. {Show previous vital signs (optional):23777::" "}  Physical Exam  ***  No results found for any visits on 04/17/20.  Assessment & Plan     ***  No follow-ups on file.      {provider attestation***:1}   Maryella Shivers  [facility] [phone] (phone) [phone] (fax)  [facility]

## 2020-04-19 NOTE — Progress Notes (Deleted)
° ° ° °  Established patient visit   Patient: Carrie Garza   DOB: Aug 28, 1996   24 y.o. Female  MRN: [id number] Visit Date: 04/22/2020  Today's healthcare provider: Jairo Ben, FNP   No chief complaint on file.  Subjective    HPI  ***  {Show patient history (optional):23778::" "}   Medications: Outpatient Medications Prior to Visit  Medication Sig   metFORMIN (GLUCOPHAGE) 500 MG tablet Take 500 mg by mouth 2 (two) times daily with a meal.   No facility-administered medications prior to visit.    Review of Systems  {Heme   Chem   Endocrine   Serology   Results Review (optional):23779::" "}  Objective    There were no vitals taken for this visit. {Show previous vital signs (optional):23777::" "}  Physical Exam  ***  No results found for any visits on 04/22/20.  Assessment & Plan     ***  No follow-ups on file.      {provider attestation***:1}   Jairo Ben, FNP  [facility] [phone] (phone) [phone] (fax)  [facility]

## 2020-04-22 ENCOUNTER — Ambulatory Visit: Payer: Medicaid Other | Admitting: Adult Health

## 2020-06-05 ENCOUNTER — Ambulatory Visit: Payer: Medicaid Other | Admitting: Family Medicine

## 2020-06-06 NOTE — Progress Notes (Signed)
Established patient visit   Patient: Carrie Garza   DOB: Sep 23, 1996   24 y.o. Female  MRN: [id number] Visit Date: 06/07/2020  Today's healthcare provider: Mila Merry, MD   Chief Complaint  Patient presents with  . Diabetes  . Leg Pain  . Exposure to STD   Subjective    Exposure to STD  The patient's primary symptoms include a discharge and genital itching. The patient's pertinent negatives include no dyspareunia, dysuria, genital lesions, genital rash or pelvic pain. This is a new problem. The current episode started 1 to 4 weeks ago. The problem has been unchanged. The vaginal discharge was thick. Associate symptoms include urinary frequency. Pertinent negatives include no abdominal pain, anorexia, diaphoresis, fever, genital odor, rectal pain or sore throat. She has tried antifungals for the symptoms. The treatment provided no relief. Risk factors include history of STDs.    Leg numbness: Patient complains of numbness of the left leg. Symptoms started 3 months ago, patient reports that in the past month numbness and pain in lower left leg has been worse. Patient reports today that she has a stabbing/burning pain that radiates to her left hip, she reports difficult walking. Patient states that that at times she feels her left knee is giving or catching itself, she does report history of surgery to left leg as a child.    She also reports her lower back hurts when she is standing or walking for more than a few minutes, it is worse on the left and has been going on for about the same period of time as her leg numbness.    Diabetes Mellitus Type II, Follow-up  Lab Results  Component Value Date   HGBA1C 5.8 (H) 01/14/2017   HGBA1C 5.9 12/09/2016   HGBA1C 5.8 11/14/2015   Wt Readings from Last 3 Encounters:  10/19/19 (!) 315 lb (142.9 kg)  07/31/19 (!) 315 lb (142.9 kg)  01/14/19 (!) 312 lb (141.5 kg)   Last seen for diabetes at [facility] in 2019  She reports  excellent compliance with treatment. She is not having side effects.  Symptoms: No fatigue No foot ulcerations  No appetite changes No nausea  No paresthesia of the feet  Yes polydipsia  Yes polyuria No visual disturbances   No vomiting     Home blood sugar records: not being checked  Episodes of hypoglycemia? No    Current insulin regiment: none Most Recent Eye Exam: 30months.  Current exercise: no regular exercise Current diet habits: well balanced  Pertinent Labs: Lab Results  Component Value Date   CHOL 115 02/01/2015   HDL 30 (A) 02/01/2015   LDLCALC 56 02/01/2015   TRIG 145 02/01/2015   Lab Results  Component Value Date   NA 135 08/04/2017   K 4.0 08/04/2017   CREATININE 0.66 08/04/2017   GFRNONAA >60 08/04/2017   GFRAA >60 08/04/2017   GLUCOSE 101 (H) 07/18/2018     --------------------------------------------------------------------------------------------------- She also reports 4-5 days of urinary frequency and dysuria with some discharge. No blood. No abdominal or flank pain. No hematuria.      Medications: Outpatient Medications Prior to Visit  Medication Sig  . Blood Glucose Monitoring Suppl (GLUCOCOM BLOOD GLUCOSE MONITOR) DEVI 1 each by XX route as directed.  . metFORMIN (GLUCOPHAGE) 500 MG tablet Take 500 mg by mouth 2 (two) times daily with a meal.   No facility-administered medications prior to visit.    Review of Systems  Constitutional: Negative for  diaphoresis and fever.  HENT: Negative for sore throat.   Gastrointestinal: Negative for abdominal pain, anorexia and rectal pain.  Genitourinary: Positive for frequency. Negative for dyspareunia, dysuria and pelvic pain.      Objective    There were no vitals taken for this visit.   Physical Exam   General: Appearance:    Severely obese female in no acute distress  Eyes:    PERRL, conjunctiva/corneas clear, EOM's intact       Lungs:     Clear to auscultation bilaterally, respirations  unlabored  Heart:    Normal heart rate. Normal rhythm. No murmurs, rubs, or gallops.   MS:   All extremities are intact. Tender left lower back pain. FROM of both knees, old surgical scars are well healed.   Neurologic:   Awake, alert, oriented x 3. No apparent focal neurological           defect.         Results for orders placed or performed in visit on 06/07/20  POCT urinalysis dipstick  Result Value Ref Range   Color, UA dark yellow    Clarity, UA cloudy    Glucose, UA Negative Negative   Bilirubin, UA negative    Ketones, UA negative    Spec Grav, UA 1.010 1.010 - 1.025   Blood, UA large    pH, UA 6.5 5.0 - 8.0   Protein, UA Positive (A) Negative   Urobilinogen, UA 0.2 0.2 or 1.0 E.U./dL   Nitrite, UA positive    Leukocytes, UA Trace (A) Negative   Appearance     Odor    POCT urine pregnancy  Result Value Ref Range   Preg Test, Ur Negative Negative    Assessment & Plan     1. Numbness and tingling of left lower extremity Suspect radiculopathy, possible neuropathy.  - CBC - Comprehensive metabolic panel - Vitamin B12 - TSH  2. Chronic left-sided low back pain with left-sided sciatica  - DG Lumbar Spine Complete; Future  3. Chronic pain of left knee  - DG Knee Complete 4 Views Left; Future  4. Urinary frequency   5. Acute cystitis without hematuria  - sulfamethoxazole-trimethoprim (BACTRIM DS) 800-160 MG tablet; Take 1 tablet by mouth 2 (two) times daily for 7 days.  Dispense: 14 tablet; Refill: 0  6. Type 2 diabetes mellitus without complication, without long-term current use of insulin (HCC) Is taking metformin, but not checking her sugars at home - Hemoglobin A1c  7. Screening for STD (sexually transmitted disease) Possible exposure from unfaithful partner - HIV antibody (with reflex) - RPR - Urine cytology ancillary only - Hepatitis, Acute - HSV(herpes simplex vrs) 1+2 ab-IgG  8. History of chlamydia    Addressed extensive list of chronic and  acute medical problems today requiring 42 minutes reviewing her medical record, counseling patient regarding her conditions and coordination of care.     No follow-ups on file.      The entirety of the information documented in the History of Present Illness, Review of Systems and Physical Exam were personally obtained by me. Portions of this information were initially documented by the CMA and reviewed by me for thoroughness and accuracy.      Mila Merry, MD  [facility] [phone] (phone) [phone] (fax)  [facility]

## 2020-06-07 ENCOUNTER — Other Ambulatory Visit (HOSPITAL_COMMUNITY)
Admission: RE | Admit: 2020-06-07 | Discharge: 2020-06-07 | Disposition: A | Discharge status: Home / Self Care | Payer: Medicaid Other | Source: Ambulatory Visit | Attending: Family Medicine | Admitting: Family Medicine

## 2020-06-07 ENCOUNTER — Encounter: Payer: Self-pay | Admitting: Family Medicine

## 2020-06-07 ENCOUNTER — Ambulatory Visit (INDEPENDENT_AMBULATORY_CARE_PROVIDER_SITE_OTHER): Payer: Medicaid Other | Admitting: Family Medicine

## 2020-06-07 ENCOUNTER — Ambulatory Visit
Admission: RE | Admit: 2020-06-07 | Discharge: 2020-06-07 | Disposition: A | Discharge status: Home / Self Care | Payer: Medicaid Other | Source: Ambulatory Visit | Attending: Family Medicine | Admitting: Family Medicine

## 2020-06-07 ENCOUNTER — Other Ambulatory Visit: Payer: Self-pay

## 2020-06-07 ENCOUNTER — Ambulatory Visit
Admission: RE | Admit: 2020-06-07 | Discharge: 2020-06-07 | Disposition: A | Discharge status: Home / Self Care | Payer: Medicaid Other | Attending: Family Medicine | Admitting: Family Medicine

## 2020-06-07 VITALS — BP 122/92 | HR 73 | Temp 98.9°F | Resp 15 | Wt 359.2 lb

## 2020-06-07 DIAGNOSIS — G8929 Other chronic pain: Secondary | ICD-10-CM | POA: Insufficient documentation

## 2020-06-07 DIAGNOSIS — Z113 Encounter for screening for infections with a predominantly sexual mode of transmission: Secondary | ICD-10-CM | POA: Diagnosis not present

## 2020-06-07 DIAGNOSIS — R200 Anesthesia of skin: Secondary | ICD-10-CM

## 2020-06-07 DIAGNOSIS — M25562 Pain in left knee: Secondary | ICD-10-CM | POA: Diagnosis not present

## 2020-06-07 DIAGNOSIS — M79605 Pain in left leg: Secondary | ICD-10-CM | POA: Insufficient documentation

## 2020-06-07 DIAGNOSIS — M5442 Lumbago with sciatica, left side: Secondary | ICD-10-CM

## 2020-06-07 DIAGNOSIS — R202 Paresthesia of skin: Secondary | ICD-10-CM

## 2020-06-07 DIAGNOSIS — R350 Frequency of micturition: Secondary | ICD-10-CM

## 2020-06-07 DIAGNOSIS — Z8619 Personal history of other infectious and parasitic diseases: Secondary | ICD-10-CM

## 2020-06-07 DIAGNOSIS — N3000 Acute cystitis without hematuria: Secondary | ICD-10-CM

## 2020-06-07 DIAGNOSIS — E119 Type 2 diabetes mellitus without complications: Secondary | ICD-10-CM

## 2020-06-07 LAB — POCT URINE PREGNANCY: Preg Test, Ur: NEGATIVE

## 2020-06-07 LAB — POCT URINALYSIS DIPSTICK
Bilirubin, UA: NEGATIVE
Glucose, UA: NEGATIVE
Ketones, UA: NEGATIVE
Nitrite, UA: POSITIVE
Protein, UA: POSITIVE — AB
Spec Grav, UA: 1.01 (ref 1.010–1.025)
Urobilinogen, UA: 0.2 E.U./dL
pH, UA: 6.5 (ref 5.0–8.0)

## 2020-06-07 MED ORDER — SULFAMETHOXAZOLE-TRIMETHOPRIM 800-160 MG PO TABS: 1.0000 | ORAL_TABLET | Freq: Two times a day (BID) | ORAL | 0 refills | Status: AC

## 2020-06-07 NOTE — Addendum Note (Signed)
Addended by: Fonda Kinder on: 06/07/2020 10:59 AM   Modules accepted: Orders

## 2020-06-08 LAB — HEMOGLOBIN A1C
Est. average glucose Bld gHb Est-mCnc: 128 mg/dL
Hgb A1c MFr Bld: 6.1 % — ABNORMAL HIGH (ref 4.8–5.6)

## 2020-06-08 LAB — COMPREHENSIVE METABOLIC PANEL
ALT: 27 IU/L (ref 0–32)
AST: 23 IU/L (ref 0–40)
Albumin/Globulin Ratio: 1.5 (ref 1.2–2.2)
Albumin: 4.3 g/dL (ref 3.9–5.0)
Alkaline Phosphatase: 79 IU/L (ref 48–121)
BUN/Creatinine Ratio: 8 — ABNORMAL LOW (ref 9–23)
BUN: 7 mg/dL (ref 6–20)
Bilirubin Total: 0.4 mg/dL (ref 0.0–1.2)
CO2: 18 mmol/L — ABNORMAL LOW (ref 20–29)
Calcium: 9.4 mg/dL (ref 8.7–10.2)
Chloride: 107 mmol/L — ABNORMAL HIGH (ref 96–106)
Creatinine, Ser: 0.93 mg/dL (ref 0.57–1.00)
GFR calc Af Amer: 98 mL/min/{1.73_m2} (ref >59)
GFR calc non Af Amer: 85 mL/min/{1.73_m2} (ref >59)
Globulin, Total: 2.9 g/dL (ref 1.5–4.5)
Glucose: 98 mg/dL (ref 65–99)
Potassium: 4.1 mmol/L (ref 3.5–5.2)
Sodium: 142 mmol/L (ref 134–144)
Total Protein: 7.2 g/dL (ref 6.0–8.5)

## 2020-06-08 LAB — TSH: TSH: 0.843 u[IU]/mL (ref 0.450–4.500)

## 2020-06-08 LAB — HEPATITIS PANEL, ACUTE
Hep A IgM: NEGATIVE
Hep B C IgM: NEGATIVE
Hep C Virus Ab: <0.1 s/co ratio (ref 0.0–0.9)
Hepatitis B Surface Ag: NEGATIVE

## 2020-06-08 LAB — CBC
Hematocrit: 39.8 % (ref 34.0–46.6)
Hemoglobin: 13.8 g/dL (ref 11.1–15.9)
MCH: 29.5 pg (ref 26.6–33.0)
MCHC: 34.7 g/dL (ref 31.5–35.7)
MCV: 85 fL (ref 79–97)
Platelets: 291 10*3/uL (ref 150–450)
RBC: 4.68 x10E6/uL (ref 3.77–5.28)
RDW: 13.6 % (ref 11.7–15.4)
WBC: 8.5 10*3/uL (ref 3.4–10.8)

## 2020-06-08 LAB — VITAMIN B12: Vitamin B-12: 537 pg/mL (ref 232–1245)

## 2020-06-08 LAB — HIV ANTIBODY (ROUTINE TESTING W REFLEX): HIV Screen 4th Generation wRfx: NONREACTIVE

## 2020-06-08 LAB — HSV(HERPES SIMPLEX VRS) I + II AB-IGG
HSV 1 Glycoprotein G Ab, IgG: 56.9 index — ABNORMAL HIGH (ref 0.00–0.90)
HSV 2 IgG, Type Spec: 10.4 index — ABNORMAL HIGH (ref 0.00–0.90)

## 2020-06-08 LAB — RPR: RPR Ser Ql: NONREACTIVE

## 2020-06-10 ENCOUNTER — Telehealth: Payer: Self-pay | Admitting: *Deleted

## 2020-06-10 LAB — URINE CYTOLOGY ANCILLARY ONLY
Bacterial Vaginitis-Urine: NEGATIVE
Candida Urine: NEGATIVE
Chlamydia: NEGATIVE
Comment: NEGATIVE
Comment: NEGATIVE
Comment: NORMAL
Neisseria Gonorrhea: NEGATIVE
Trichomonas: POSITIVE — AB

## 2020-06-10 NOTE — Telephone Encounter (Signed)
Please advise lab results from 06/07/2020?

## 2020-06-10 NOTE — Telephone Encounter (Signed)
Copied from CRM [id number]. Topic: Quick Communication - Other Results (Clinic Use ONLY) >> Jun 10, 2020  1:28 PM Leafy Ro wrote: PT is calling and would like blood work results from Friday.

## 2020-06-12 ENCOUNTER — Encounter: Payer: Self-pay | Admitting: Family Medicine

## 2020-06-12 ENCOUNTER — Telehealth: Payer: Self-pay

## 2020-06-12 DIAGNOSIS — M179 Osteoarthritis of knee, unspecified: Secondary | ICD-10-CM | POA: Insufficient documentation

## 2020-06-12 DIAGNOSIS — B009 Herpesviral infection, unspecified: Secondary | ICD-10-CM | POA: Insufficient documentation

## 2020-06-12 LAB — URINE CULTURE

## 2020-06-12 MED ORDER — VALACYCLOVIR HCL 500 MG PO TABS: 500.0000 mg | ORAL_TABLET | Freq: Two times a day (BID) | ORAL | 12 refills | Status: AC

## 2020-06-12 MED ORDER — METRONIDAZOLE 500 MG PO TABS: ORAL_TABLET | ORAL | 0 refills | Status: AC

## 2020-06-12 NOTE — Telephone Encounter (Signed)
-----   Message from Malva Limes, MD sent at 06/12/2020  1:50 PM EDT ----- Labs positive for trichomonas and herpes virus. She needs to take metronidazole 500mg , two tablets every 12 hours x 2 (4 tablets) to cure trichomonas.  There is no cure for herpes virus, but can be suppressed. Recommend taking valacyclovir 500mg  twice a day, 60, rf x 12.

## 2020-06-12 NOTE — Telephone Encounter (Signed)
-----   Message from Malva Limes, MD sent at 06/12/2020  1:52 PM EDT ----- Herby Abraham show moderate arthritis in her left knee. This will get much worse if she doesn't lose weight. Xray of back is normal.

## 2020-06-12 NOTE — Telephone Encounter (Signed)
Patient has been advised, she states that she has been taking NSAIDS for pain relief for her knee but states that it does not help with pain, patient wants to know what she can take for relief? KW

## 2020-07-16 ENCOUNTER — Other Ambulatory Visit: Payer: Self-pay

## 2020-07-16 ENCOUNTER — Emergency Department: Payer: Medicaid Other

## 2020-07-16 ENCOUNTER — Encounter: Payer: Self-pay | Admitting: Emergency Medicine

## 2020-07-16 DIAGNOSIS — Z7984 Long term (current) use of oral hypoglycemic drugs: Secondary | ICD-10-CM | POA: Diagnosis not present

## 2020-07-16 DIAGNOSIS — R079 Chest pain, unspecified: Secondary | ICD-10-CM | POA: Diagnosis not present

## 2020-07-16 DIAGNOSIS — Z87891 Personal history of nicotine dependence: Secondary | ICD-10-CM | POA: Diagnosis not present

## 2020-07-16 DIAGNOSIS — J45909 Unspecified asthma, uncomplicated: Secondary | ICD-10-CM | POA: Diagnosis not present

## 2020-07-16 DIAGNOSIS — E119 Type 2 diabetes mellitus without complications: Secondary | ICD-10-CM | POA: Insufficient documentation

## 2020-07-16 LAB — BASIC METABOLIC PANEL
Anion gap: 10 (ref 5–15)
BUN: 12 mg/dL (ref 6–20)
CO2: 21 mmol/L — ABNORMAL LOW (ref 22–32)
Calcium: 8.8 mg/dL — ABNORMAL LOW (ref 8.9–10.3)
Chloride: 105 mmol/L (ref 98–111)
Creatinine, Ser: 0.87 mg/dL (ref 0.44–1.00)
GFR calc non Af Amer: >60 mL/min (ref >60)
Glucose, Bld: 181 mg/dL — ABNORMAL HIGH (ref 70–99)
Potassium: 3.3 mmol/L — ABNORMAL LOW (ref 3.5–5.1)
Sodium: 136 mmol/L (ref 135–145)

## 2020-07-16 LAB — CBC
HCT: 38.5 % (ref 36.0–46.0)
Hemoglobin: 13 g/dL (ref 12.0–15.0)
MCH: 29 pg (ref 26.0–34.0)
MCHC: 33.8 g/dL (ref 30.0–36.0)
MCV: 85.9 fL (ref 80.0–100.0)
Platelets: 240 10*3/uL (ref 150–400)
RBC: 4.48 MIL/uL (ref 3.87–5.11)
RDW: 13.8 % (ref 11.5–15.5)
WBC: 9.7 10*3/uL (ref 4.0–10.5)
nRBC: 0 % (ref 0.0–0.2)

## 2020-07-16 LAB — TROPONIN I (HIGH SENSITIVITY): Troponin I (High Sensitivity): 5 ng/L (ref <18)

## 2020-07-16 NOTE — ED Triage Notes (Signed)
Pt arrived via [facility] with c/o lung pain that caused her chest to start hurting. Sxs began around 6pm while taking a shower.  No distress noted, respirations equal and unlabored. Pt able to speak in complete sentences without running out of breath.  Pt also concerned Nexplanon has moved in her arm.

## 2020-07-16 NOTE — ED Triage Notes (Signed)
EMS brings pt in from home for c/o "lung pain x 2weeks"; pt to lobby via w/c with no distress noted

## 2020-07-17 ENCOUNTER — Encounter: Payer: Self-pay | Admitting: Radiology

## 2020-07-17 ENCOUNTER — Emergency Department
Admission: EM | Admit: 2020-07-17 | Discharge: 2020-07-17 | Disposition: A | Discharge status: Home / Self Care | Payer: Medicaid Other | Attending: Emergency Medicine | Admitting: Emergency Medicine

## 2020-07-17 ENCOUNTER — Emergency Department: Payer: Medicaid Other

## 2020-07-17 DIAGNOSIS — R079 Chest pain, unspecified: Secondary | ICD-10-CM

## 2020-07-17 LAB — TROPONIN I (HIGH SENSITIVITY): Troponin I (High Sensitivity): 4 ng/L (ref <18)

## 2020-07-17 LAB — FIBRIN DERIVATIVES D-DIMER (ARMC ONLY): Fibrin derivatives D-dimer (ARMC): 615.92 ng/mL (FEU) — ABNORMAL HIGH (ref 0.00–499.00)

## 2020-07-17 LAB — POCT PREGNANCY, URINE: Preg Test, Ur: NEGATIVE

## 2020-07-17 MED ORDER — IOHEXOL 350 MG/ML SOLN
100.0000 mL | Freq: Once | INTRAVENOUS | Status: AC | PRN
  Administered 2020-07-17: 100 mL via INTRAVENOUS

## 2020-07-17 NOTE — ED Notes (Signed)
Pt denies further questions at this time. Pt ambulatory to lobby with ride home.

## 2020-07-17 NOTE — ED Provider Notes (Signed)
[facility] Emergency Department Provider Note  ____________________________________________   First MD Initiated Contact with Patient 07/17/20 0205     (approximate)  I have reviewed the triage vital signs and the nursing notes.   HISTORY  Chief Complaint Chest Pain    HPI Carrie Garza is a 24 y.o. female with below list of previous medical conditions including diabetes PCOS presents to the emergency department secondary concern for Nexplanon as moved in her arm.  Patient states that she has had a Nexplanon in place for the past 2 years and is requesting to have it removed at this point.  In addition patient admitted to chest pain she describes as "lung pain" that began while taking a shower at 6 PM this evening.  Patient states that pain is since resolved.        Past Medical History:  Diagnosis Date  . ADHD (attention deficit hyperactivity disorder)   . Asthma   . Diabetes mellitus without complication (HCC)   . Elevated blood pressure   . History of chicken pox   . Knee fracture, left 02/2003  . Obesity   . Polycystic ovarian syndrome   . Pregnancy induced hypertension     Patient Active Problem List   Diagnosis Date Noted  . Herpes 06/12/2020  . OA (osteoarthritis) of knee 06/12/2020  . Indication for care in labor and delivery, antepartum 07/04/2018  . Labor and delivery indication for care or intervention 08/04/2017  . History of fetal abnormality in previous pregnancy, currently pregnant 04/16/2017  . Abnormal genetic test during pregnancy 03/16/2017  . Pre-existing type 2 diabetes mellitus during pregnancy, antepartum 02/11/2017  . Supervision of high risk pregnancy, antepartum 01/06/2017  . Obesity affecting pregnancy, antepartum 01/06/2017  . BMI 50.0-59.9, adult (HCC) 01/06/2017  . Positive pregnancy test 03/05/2016  . Acanthosis nigricans 09/11/2015  . ADHD (attention deficit hyperactivity disorder) 09/11/2015  . Asthma  09/11/2015  . Blood pressure elevated without history of HTN 09/11/2015  . Eczema intertrigo 09/11/2015  . Polycystic ovarian syndrome 09/11/2015  . Diabetes mellitus, type 2 (HCC) 09/11/2015  . History of chlamydia 07/29/2009    Past Surgical History:  Procedure Laterality Date  . LABIOPLASTY  2008   hypertrophy B labia. Toya Smothers  . TONSILLECTOMY AND ADENOIDECTOMY  2001    Prior to Admission medications   Medication Sig Start Date End Date Taking? Authorizing Provider  Blood Glucose Monitoring Suppl (GLUCOCOM BLOOD GLUCOSE MONITOR) DEVI 1 each by XX route as directed. 03/18/17   [provider]  metFORMIN (GLUCOPHAGE) 500 MG tablet Take 500 mg by mouth 2 (two) times daily with a meal.    [provider]  metroNIDAZOLE (FLAGYL) 500 MG tablet Take two tablets every 12hrs. 06/12/20   Malva Limes, MD  valACYclovir (VALTREX) 500 MG tablet Take 1 tablet (500 mg total) by mouth 2 (two) times daily. 06/12/20   Malva Limes, MD    Allergies Cefzil [cefprozil]  Family History  Problem Relation Age of Onset  . Diabetes Mother   . Hypertension Mother   . Asthma Mother   . Obesity Mother   . Hypertension Maternal Grandmother   . Prostate cancer Maternal Grandfather   . Hypertension Maternal Grandfather   . Diabetes Other        type 2  . Stroke Other     Social History Social History   Tobacco Use  . Smoking status: Former Smoker    Quit date: 08/12/2013  Years since quitting: 6.9  . Smokeless tobacco: Never Used  Substance Use Topics  . Alcohol use: No    Alcohol/week: 0.0 standard drinks  . Drug use: No    Review of Systems Constitutional: No fever/chills Eyes: No visual changes. ENT: No sore throat. Cardiovascular: Positive chest pain. Respiratory: Denies shortness of breath. Gastrointestinal: No abdominal pain.  No nausea, no vomiting.  No diarrhea.  No constipation. Genitourinary: Negative for dysuria. Musculoskeletal: Negative for  neck pain.  Negative for back pain. Integumentary: Negative for rash. Neurological: Negative for headaches, focal weakness or numbness.  ____________________________________________   PHYSICAL EXAM:  VITAL SIGNS: ED Triage Vitals  Enc Vitals Group     BP 07/16/20 2113 134/76     Pulse Rate 07/16/20 2113 82     Resp 07/16/20 2113 18     Temp 07/16/20 2113 99.2 F (37.3 C)     Temp Source 07/16/20 2113 Oral     SpO2 07/16/20 2049 99 %     Weight 07/16/20 2113 (!) 161.5 kg (356 lb)     Height 07/16/20 2113 1.676 m (5\' 6" )     Head Circumference --      Peak Flow --      Pain Score 07/16/20 2112 6     Pain Loc --      Pain Edu? --      Excl. in GC? --     Constitutional: Alert and oriented.  Eyes: Conjunctivae are normal.  Head: Atraumatic. Mouth/Throat: Patient is wearing a mask. Neck: No stridor.  No meningeal signs.   Cardiovascular: Normal rate, regular rhythm. Good peripheral circulation. Grossly normal heart sounds. Respiratory: Normal respiratory effort.  No retractions. Gastrointestinal: Soft and nontender. No distention.  Musculoskeletal: No lower extremity tenderness nor edema. No gross deformities of extremities. Neurologic:  Normal speech and language. No gross focal neurologic deficits are appreciated.  Skin:  Skin is warm, dry and intact. Psychiatric: Mood and affect are normal. Speech and behavior are normal.  ____________________________________________   LABS (all labs ordered are listed, but only abnormal results are displayed)  Labs Reviewed  BASIC METABOLIC PANEL - Abnormal; Notable for the following components:      Result Value   Potassium 3.3 (*)    CO2 21 (*)    Glucose, Bld 181 (*)    Calcium 8.8 (*)    All other components within normal limits  FIBRIN DERIVATIVES D-DIMER ([facility] ONLY) - Abnormal; Notable for the following components:   Fibrin derivatives D-dimer ([facility]) 615.92 (*)    All other components within normal limits  CBC  POC  URINE PREG, ED  TROPONIN I (HIGH SENSITIVITY)  TROPONIN I (HIGH SENSITIVITY)   ____________________________________________  EKG  ED ECG REPORT I, [place] N Issa Luster, the attending physician, personally viewed and interpreted this ECG.   Date: 07/16/2020  EKG Time: 9:28 PM  Rate: 82  Rhythm: Normal sinus rhythm  Axis: Normal  Intervals:Normal  ST&T Change: None  ____________________________________________  RADIOLOGY I, [place] N Meric Joye, personally viewed and evaluated these images (plain radiographs) as part of my medical decision making, as well as reviewing the written report by the radiologist.  ED MD interpretation: No active cardiopulmonary disease noted on chest x-ray CT chest revealed no pulmonary emboli.  Superficial implants seen within the medial distal subcutaneous soft tissue per radiologist.  Official radiology report(s): DG Chest 2 View  Result Date: 07/16/2020 CLINICAL DATA:  Chest pain EXAM: CHEST - 2 VIEW COMPARISON:  10/07/2015 FINDINGS: The  heart size and mediastinal contours are within normal limits. Both lungs are clear. The visualized skeletal structures are unremarkable. IMPRESSION: No active cardiopulmonary disease. Electronically Signed   By: Alcide Clever M.D.   On: 07/16/2020 21:41   DG Humerus Left  Result Date: 07/17/2020 CLINICAL DATA:  Implant moved EXAM: LEFT HUMERUS - 2+ VIEW COMPARISON:  None. FINDINGS: There is no evidence of fracture or other focal bone lesions. There is a 3.4 cm linear implant seen along the medial distal subcutaneous soft tissues. IMPRESSION: Superficial implant seen within the medial distal subcutaneous soft tissues. Electronically Signed   By: Jonna Clark M.D.   On: 07/17/2020 00:06    Procedures   ____________________________________________   INITIAL IMPRESSION / MDM / ASSESSMENT AND PLAN / ED COURSE  As part of my medical decision making, I reviewed the following data within the electronic MEDICAL RECORD NUMBER   24 year old female presented with above-stated history and physical exam with request for Nexplanon removal and chest pain that is since resolved since onset.  Given chest pain, Nexplanon, cigarette use, positive D-dimer CT chest performed which  revealed no evidence of pulmonary emboli.  Patient be referred to OB/GYN for Nexplanon removal ____________________________________________  FINAL CLINICAL IMPRESSION(S) / ED DIAGNOSES  Final diagnoses:  Chest pain, unspecified type     MEDICATIONS GIVEN DURING THIS VISIT:  Medications - No data to display   ED Discharge Orders    None      *Please note:  Carrie Garza was evaluated in Emergency Department on 07/17/2020 for the symptoms described in the history of present illness. She was evaluated in the context of the global COVID-19 pandemic, which necessitated consideration that the patient might be at risk for infection with the SARS-CoV-2 virus that causes COVID-19. Institutional protocols and algorithms that pertain to the evaluation of patients at risk for COVID-19 are in a state of rapid change based on information released by regulatory bodies including the CDC and federal and state organizations. These policies and algorithms were followed during the patient's care in the ED.  Some ED evaluations and interventions may be delayed as a result of limited staffing during and after the pandemic.*  Note:  This document was prepared using Dragon voice recognition software and may include unintentional dictation errors.   Darci Current, MD 07/17/20 [phone]

## 2021-07-01 ENCOUNTER — Encounter: Payer: Medicaid Other | Admitting: Family Medicine

## 2021-07-01 NOTE — Progress Notes (Deleted)
Complete physical exam   Patient: Carrie Garza   DOB: [birth year]   25 y.o. Female  MRN: [id number] Visit Date: 07/01/2021  Today's healthcare provider: Lelon Huh, MD   No chief complaint on file.  Subjective    Carrie Garza is a 25 y.o. female who presents today for a complete physical exam.  She reports consuming a {diet types:17450} diet. {Exercise:19826} She generally feels {well/fairly well/poorly:18703}. She reports sleeping {well/fairly well/poorly:18703}. She {does/does not:200015} have additional problems to discuss today.   Last pap: 01/06/2017- normal, HPV negative  HPI  Diabetes Mellitus Type II, Follow-up  Lab Results  Component Value Date   HGBA1C 6.1 (H) 06/07/2020   HGBA1C 5.8 (H) 01/14/2017   HGBA1C 5.9 12/09/2016   Wt Readings from Last 3 Encounters:  07/16/20 (!) 356 lb (161.5 kg)  06/07/20 (!) 359 lb 3.2 oz (162.9 kg)  10/19/19 (!) 315 lb (142.9 kg)   Last seen for diabetes 1  year  ago.  Management since then includes continue same medication. She reports {excellent/good/fair/poor:19665} compliance with treatment. She {is/is not:21021397} having side effects. {document side effects if present:1} Symptoms: {Yes/No:20286} fatigue {Yes/No:20286} foot ulcerations  {Yes/No:20286} appetite changes {Yes/No:20286} nausea  {Yes/No:20286} paresthesia of the feet  {Yes/No:20286} polydipsia  {Yes/No:20286} polyuria {Yes/No:20286} visual disturbances   {Yes/No:20286} vomiting     Home blood sugar records: {diabetes glucometry results:16657}  Episodes of hypoglycemia? {Yes/No:20286} {enter symptoms and frequency of symptoms if yes:1}   Current insulin regiment: none Most Recent Eye Exam: >1 year ago {Current exercise:16438:::1} {Current diet habits:16563:::1}  Pertinent Labs: Lab Results  Component Value Date   CHOL 115 02/01/2015   HDL 30 (A) 02/01/2015   LDLCALC 56 02/01/2015   TRIG 145 02/01/2015   Lab Results  Component Value Date    NA 136 07/16/2020   K 3.3 (L) 07/16/2020   CREATININE 0.87 07/16/2020   GFRNONAA >60 07/16/2020   GFRAA 98 06/07/2020   GLUCOSE 181 (H) 07/16/2020     ---------------------------------------------------------------------------------------------------   Past Medical History:  Diagnosis Date   ADHD (attention deficit hyperactivity disorder)    Asthma    Diabetes mellitus without complication (HCC)    Elevated blood pressure    History of chicken pox    Knee fracture, left 02/2003   Obesity    Polycystic ovarian syndrome    Pregnancy induced hypertension    Past Surgical History:  Procedure Laterality Date   LABIOPLASTY  2008   hypertrophy B labia. Georga Bora   TONSILLECTOMY AND ADENOIDECTOMY  2001   Social History   Socioeconomic History   Marital status: Single    Spouse name: Not on file   Number of children: 0   Years of education: Not on file   Highest education level: Not on file  Occupational History   Not on file  Tobacco Use   Smoking status: Former    Types: Cigarettes    Quit date: 08/12/2013    Years since quitting: 7.8   Smokeless tobacco: Never  Substance and Sexual Activity   Alcohol use: No    Alcohol/week: 0.0 standard drinks   Drug use: No   Sexual activity: Yes    Partners: Male    Comment: 2+ lifetime partners. sporadic condom use. Chlamydia 2010.   Other Topics Concern   Not on file  Social History Narrative   Not on file   Social Determinants of Health   Financial Resource Strain: Not on file  Food Insecurity: Not  on file  Transportation Needs: Not on file  Physical Activity: Not on file  Stress: Not on file  Social Connections: Not on file  Intimate Partner Violence: Not on file   Family Status  Relation Name Status   Mother  Alive   Brother  Alive   MGM  Alive   MGF  (Not Specified)   Other uncle (Not Specified)   Other  (Not Specified)   Family History  Problem Relation Age of Onset   Diabetes Mother    Hypertension  Mother    Asthma Mother    Obesity Mother    Hypertension Maternal Grandmother    Prostate cancer Maternal Grandfather    Hypertension Maternal Grandfather    Diabetes Other        type 2   Stroke Other    Allergies  Allergen Reactions   Cefzil [Cefprozil] Hives    Patient Care Team: Birdie Sons, MD as PCP - General (Family Medicine) Carloyn Manner, MD as Referring Physician (Otolaryngology) Elgie Collard, MD as Referring Physician (Gynecology)   Medications: Outpatient Medications Prior to Visit  Medication Sig   Blood Glucose Monitoring Suppl (GLUCOCOM BLOOD GLUCOSE MONITOR) [place] 1 each by XX route as directed.   metFORMIN (GLUCOPHAGE) 500 MG tablet Take 500 mg by mouth 2 (two) times daily with a meal.   metroNIDAZOLE (FLAGYL) 500 MG tablet Take two tablets every 12hrs.   valACYclovir (VALTREX) 500 MG tablet Take 1 tablet (500 mg total) by mouth 2 (two) times daily.   No facility-administered medications prior to visit.    Review of Systems  {Labs  Heme  Chem  Endocrine  Serology  Results Review (optional):23779}  Objective    There were no vitals taken for this visit. {Show previous vital signs (optional):23777}  Physical Exam  ***  Last depression screening scores No flowsheet data found. Last fall risk screening No flowsheet data found. Last Audit-C alcohol use screening No flowsheet data found. A score of 3 or more in women, and 4 or more in men indicates increased risk for alcohol abuse, EXCEPT if all of the points are from question 1   No results found for any visits on 07/01/21.  Assessment & Plan    Routine Health Maintenance and Physical Exam  Exercise Activities and Dietary recommendations  Goals   None     Immunization History  Administered Date(s) Administered   DTaP 01/27/1994, 03/19/1994, 05/21/1994, 02/23/1995   HPV Quadrivalent 09/16/2010, 12/03/2010, 04/13/2011   Hepatitis B 07/19/1994, 01/27/1994, 08/17/1994    HiB (PRP-OMP) 01/27/1994, 03/19/1994, 05/21/1994, 02/23/1995   IPV 01/27/1994, 03/19/1994, 05/21/1994   Influenza,inj,Quad PF,6+ Mos 07/13/2017, 08/03/2018   Influenza-Unspecified 06/25/2016   MMR 12/22/1994   Tdap 08/01/2009, 06/15/2017, 08/03/2018    Health Maintenance  Topic Date Due   COVID-19 Vaccine (1) Never done   FOOT EXAM  Never done   OPHTHALMOLOGY EXAM  Never done   URINE MICROALBUMIN  Never done   PAP-Cervical Cytology Screening  01/31/2018   PAP SMEAR-Modifier  01/07/2020   HEMOGLOBIN A1C  12/08/2020   INFLUENZA VACCINE  05/12/2021   TETANUS/TDAP  08/03/2028   HPV VACCINES  Completed   Hepatitis C Screening  Completed   HIV Screening  Completed    Discussed health benefits of physical activity, and encouraged her to engage in regular exercise appropriate for her age and condition.  ***  No follow-ups on file.     {provider attestation***:1}   Lelon Huh, MD  [facility]  [phone] (phone) [phone] (fax)  [place]

## 2022-06-24 IMAGING — CT CT ANGIO CHEST
2 of 6 series · 18 of 46 positions shown · IV contrast (APPLIED)
Comparison: None.

CLINICAL DATA: Chest pain

EXAM:
CT ANGIOGRAPHY CHEST WITH CONTRAST
TECHNIQUE: Multidetector CT imaging of the chest was performed using the
standard protocol during bolus administration of intravenous
contrast. Multiplanar CT image reconstructions and MIPs were
obtained to evaluate the vascular anatomy.
CONTRAST:  100mL OMNIPAQUE IOHEXOL 350 MG/ML SOLN

[Series 5: thins · axial · 0.70mm/px · z∈[-531,-290]mm · 15 of 265 slices shown]
[im 12/265  lung]
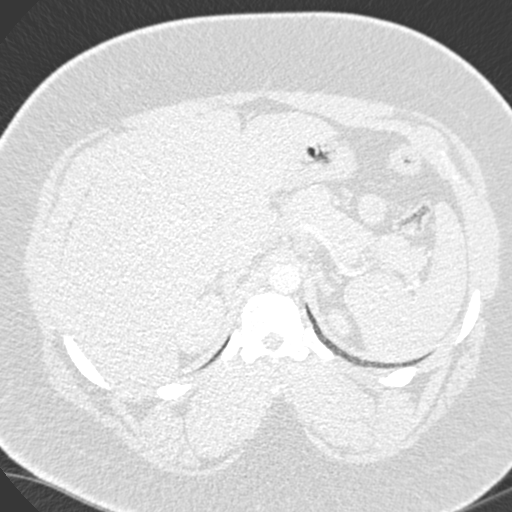
[im 35/265  soft-tissue]
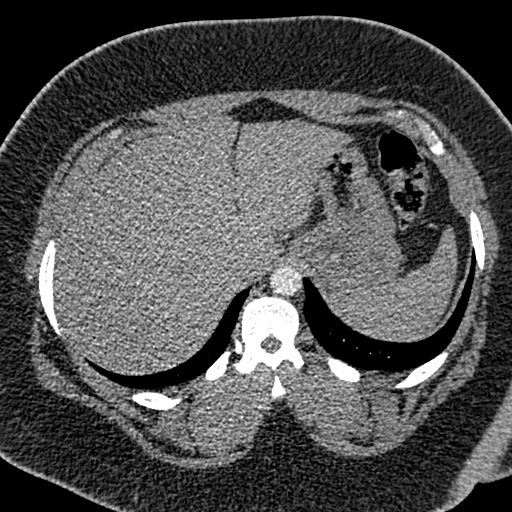
[im 46/265  lung]
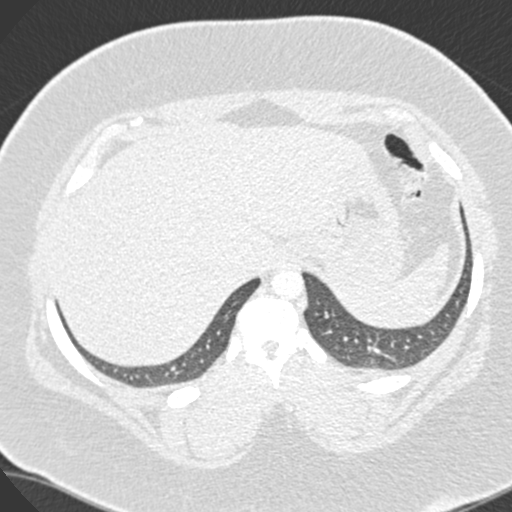
[im 69/265  soft-tissue]
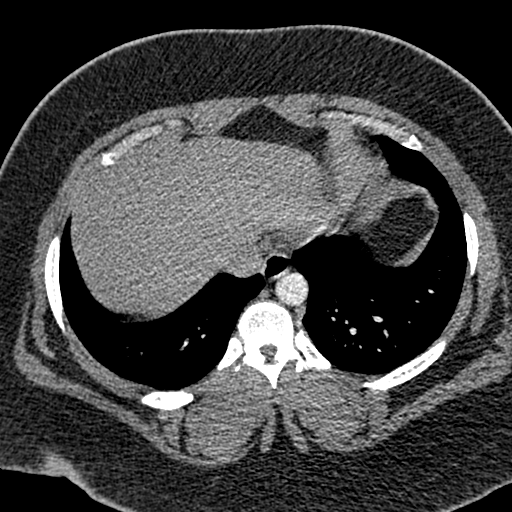
[im 81/265  lung]
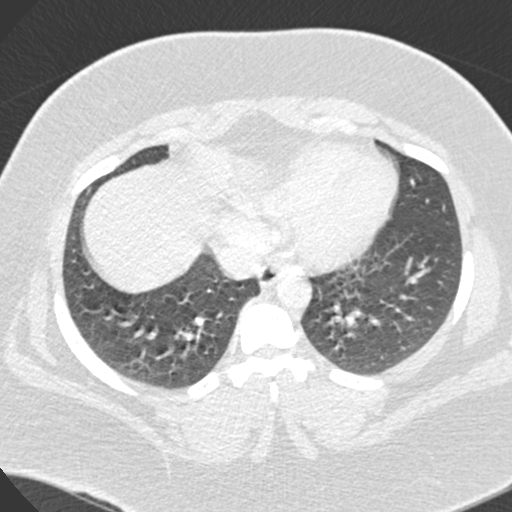
[im 104/265  soft-tissue]
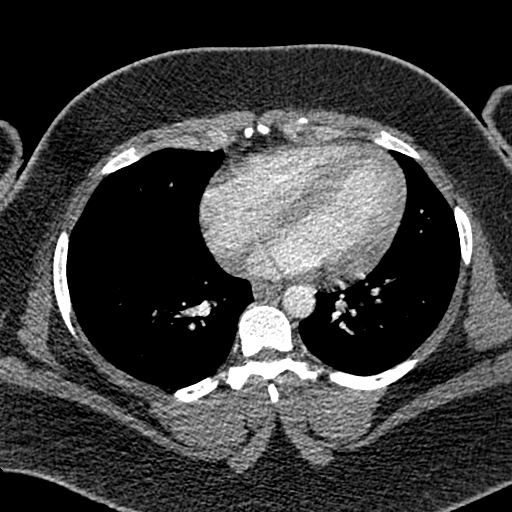
[im 115/265  lung]
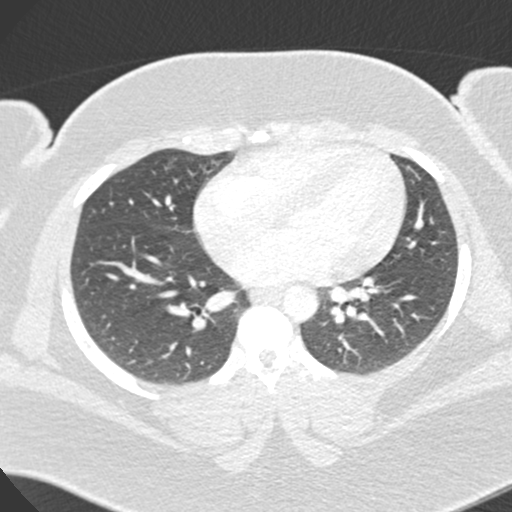
[im 138/265  soft-tissue]
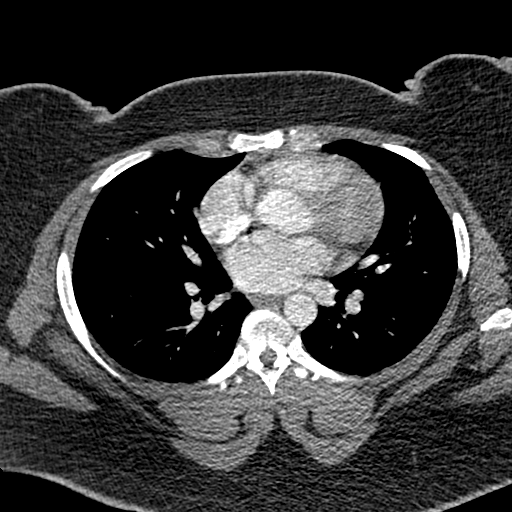
[im 150/265  lung]
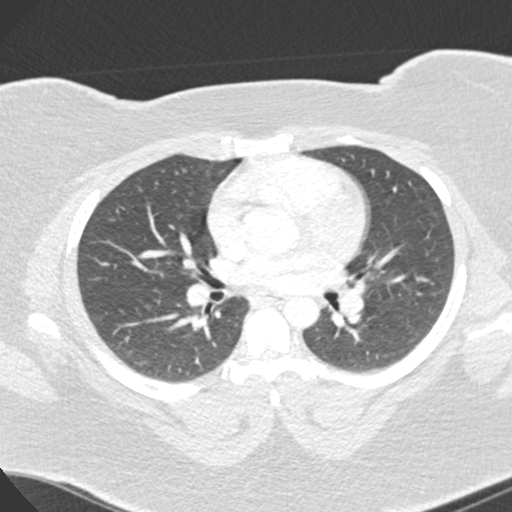
[im 161/265  soft-tissue]
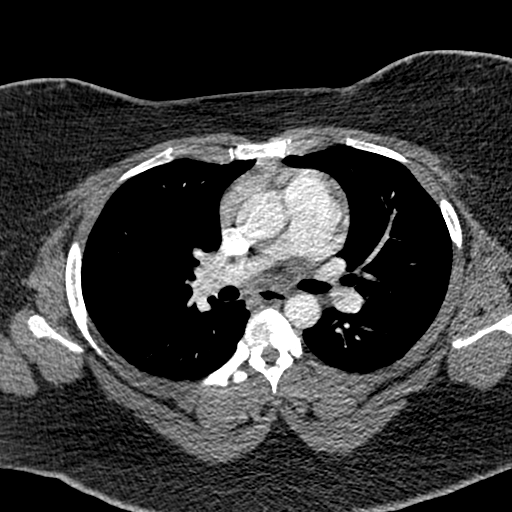
[im 184/265  lung]
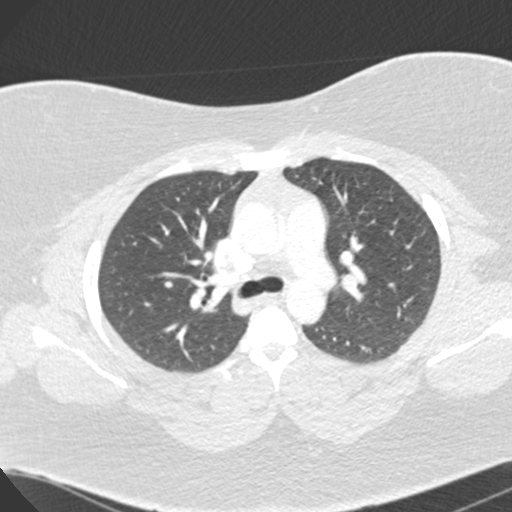
[im 196/265  soft-tissue]
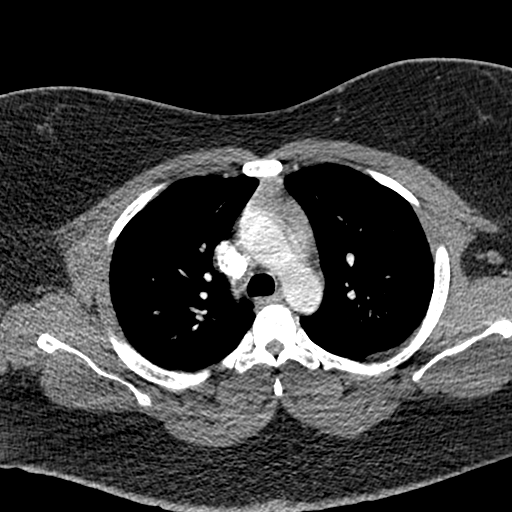
[im 219/265  lung]
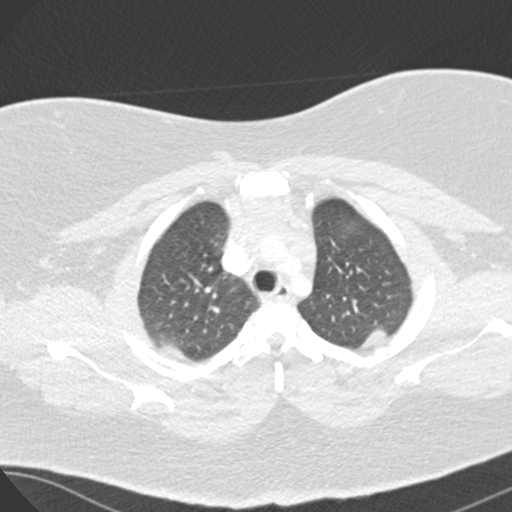
[im 230/265  soft-tissue]
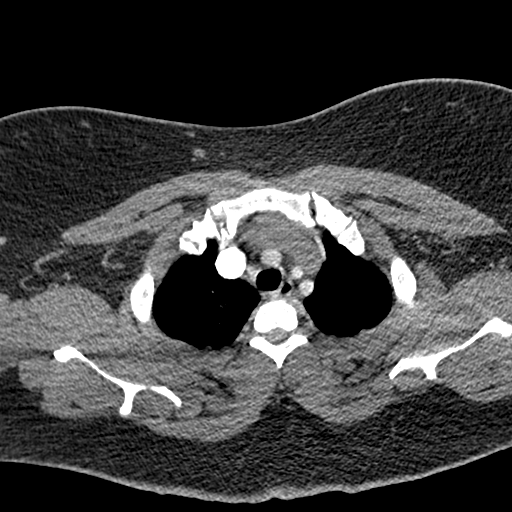
[im 253/265  lung]
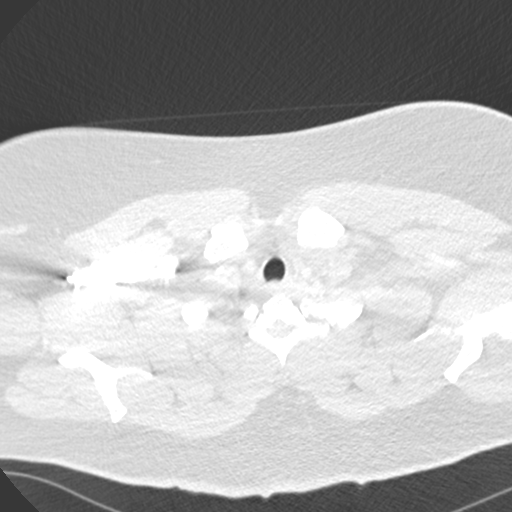

[Series 7: coronal mpr · coronal · 0.52mm/px · 3 of 95 slices shown]
[im 24/95  soft-tissue]
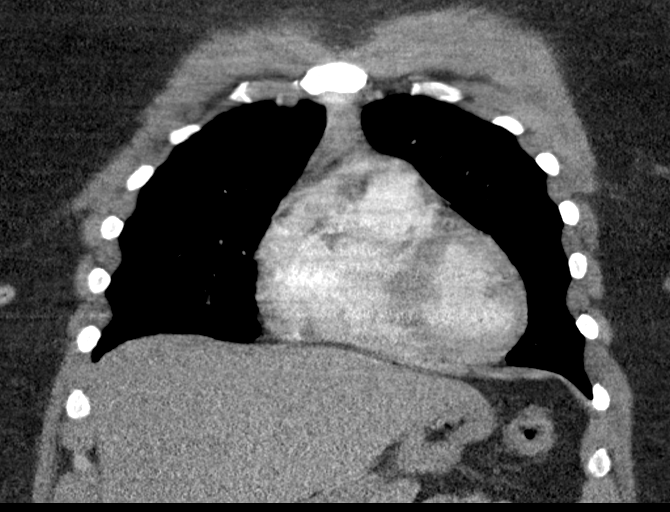
[im 48/95  soft-tissue]
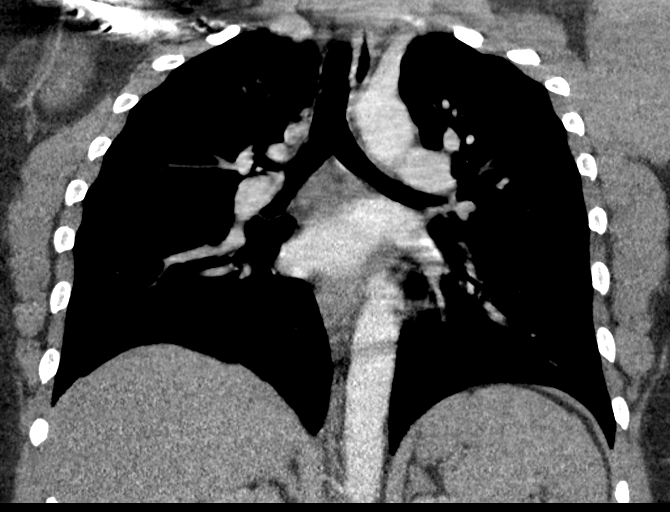
[im 71/95  soft-tissue]
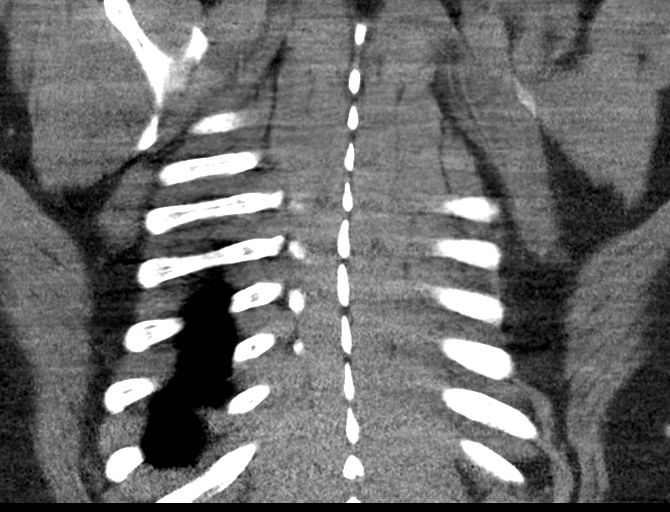

[18 of 46 positions shown; findings below may reference images not displayed]

FINDINGS: Cardiovascular: There is slightly suboptimal opacification of the
main pulmonary artery, however no central or proximal segmental
pulmonary embolism is seen. The heart is normal in size. No
pericardial effusion or thickening. No evidence right heart strain.
There is normal three-vessel brachiocephalic anatomy without
proximal stenosis. The thoracic aorta is normal in appearance.

Mediastinum/Nodes: No hilar, mediastinal, or axillary adenopathy.
Thyroid gland, trachea, and esophagus demonstrate no significant
findings.

Lungs/Pleura: The lungs are clear. No pleural effusion or
pneumothorax. No airspace consolidation.

Upper Abdomen: No acute abnormalities present in the visualized
portions of the upper abdomen.

Musculoskeletal: No chest wall abnormality. No acute or significant
osseous findings.

Review of the MIP images confirms the above findings.
IMPRESSION: Slightly suboptimal opacification of the main pulmonary artery,
however no central or proximal segmental pulmonary embolism.

No other acute intrathoracic pathology to explain the patient's
symptoms.

## 2022-09-10 ENCOUNTER — Encounter: Payer: Self-pay | Admitting: Family Medicine

## 2022-09-11 ENCOUNTER — Ambulatory Visit: Payer: Medicaid Other

## 2022-09-11 ENCOUNTER — Encounter: Payer: Medicaid Other | Admitting: Family Medicine

## 2022-09-11 DIAGNOSIS — Z6841 Body Mass Index (BMI) 40.0 and over, adult: Secondary | ICD-10-CM

## 2022-09-11 DIAGNOSIS — Z113 Encounter for screening for infections with a predominantly sexual mode of transmission: Secondary | ICD-10-CM

## 2022-09-11 DIAGNOSIS — Z01419 Encounter for gynecological examination (general) (routine) without abnormal findings: Secondary | ICD-10-CM

## 2022-09-11 DIAGNOSIS — Z3046 Encounter for surveillance of implantable subdermal contraceptive: Secondary | ICD-10-CM
